# Patient Record
Sex: Female | Born: 1965 | Race: White | Hispanic: No | Marital: Married | Smoking: Never smoker
Health system: Southern US, Community
[De-identification: ages and names within clinical notes are randomized; demographics above are authoritative.]

## PROBLEM LIST (undated history)

## (undated) DIAGNOSIS — E785 Hyperlipidemia, unspecified: Secondary | ICD-10-CM

## (undated) DIAGNOSIS — F419 Anxiety disorder, unspecified: Secondary | ICD-10-CM

## (undated) DIAGNOSIS — Z8249 Family history of ischemic heart disease and other diseases of the circulatory system: Secondary | ICD-10-CM

## (undated) DIAGNOSIS — J45909 Unspecified asthma, uncomplicated: Secondary | ICD-10-CM

## (undated) DIAGNOSIS — O24419 Gestational diabetes mellitus in pregnancy, unspecified control: Secondary | ICD-10-CM

## (undated) HISTORY — DX: Unspecified asthma, uncomplicated: J45.909

## (undated) HISTORY — DX: Family history of ischemic heart disease and other diseases of the circulatory system: Z82.49

## (undated) HISTORY — DX: Gestational diabetes mellitus in pregnancy, unspecified control: O24.419

## (undated) HISTORY — DX: Hyperlipidemia, unspecified: E78.5

## (undated) HISTORY — DX: Anxiety disorder, unspecified: F41.9

---
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
## 2017-11-09 DIAGNOSIS — G43109 Migraine with aura, not intractable, without status migrainosus: Secondary | ICD-10-CM | POA: Insufficient documentation

## 2018-02-21 HISTORY — PX: COLONOSCOPY: SHX174

## 2018-02-21 LAB — HM COLONOSCOPY

## 2018-05-02 DIAGNOSIS — F320 Major depressive disorder, single episode, mild: Secondary | ICD-10-CM | POA: Insufficient documentation

## 2021-04-03 ENCOUNTER — Ambulatory Visit: Payer: 59 | Admitting: Family

## 2021-04-08 ENCOUNTER — Telehealth (INDEPENDENT_AMBULATORY_CARE_PROVIDER_SITE_OTHER): Payer: 59 | Admitting: Family

## 2021-04-08 ENCOUNTER — Other Ambulatory Visit: Payer: Self-pay

## 2021-04-08 ENCOUNTER — Encounter: Payer: Self-pay | Admitting: Family

## 2021-04-08 VITALS — BP 117/80 | Wt 150.0 lb

## 2021-04-08 DIAGNOSIS — F419 Anxiety disorder, unspecified: Secondary | ICD-10-CM | POA: Diagnosis not present

## 2021-04-08 DIAGNOSIS — Z780 Asymptomatic menopausal state: Secondary | ICD-10-CM

## 2021-04-08 DIAGNOSIS — Z8249 Family history of ischemic heart disease and other diseases of the circulatory system: Secondary | ICD-10-CM

## 2021-04-08 DIAGNOSIS — J301 Allergic rhinitis due to pollen: Secondary | ICD-10-CM

## 2021-04-08 DIAGNOSIS — Z124 Encounter for screening for malignant neoplasm of cervix: Secondary | ICD-10-CM

## 2021-04-08 DIAGNOSIS — F32A Depression, unspecified: Secondary | ICD-10-CM

## 2021-04-08 DIAGNOSIS — Z1231 Encounter for screening mammogram for malignant neoplasm of breast: Secondary | ICD-10-CM

## 2021-04-08 MED ORDER — AZELASTINE HCL 0.1 % NA SOLN: 2.0000 | Freq: Two times a day (BID) | NASAL | 1 refills | Status: DC

## 2021-04-08 MED ORDER — SERTRALINE HCL 50 MG PO TABS: 1.0000 | ORAL_TABLET | Freq: Every day | ORAL | 1 refills | Status: DC

## 2021-04-08 NOTE — Progress Notes (Signed)
Audrey Long is a 55 y.o. female with the following history as recorded in EpicCare:  Patient Active Problem List   Diagnosis Date Noted  . 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 albuterol (VENTOLIN HFA) 108 (90 Base) MCG/ACT inhaler Inhale into the lungs every 6 (six) hours as needed for wheezing or shortness of breath.    Marland Kitchen azelastine (ASTELIN) 0.1 % nasal spray Place 2 sprays into both nostrils 2 (two) times daily. Use in each nostril as directed 30 mL 1  . sertraline (ZOLOFT) 50 MG tablet Take 1 tablet (50 mg total) by mouth daily. 90 tablet 1   No current facility-administered medications for this visit.    Allergies: Patient has no allergy information on record.  No past medical history on file.    Family History  Problem Relation Age of Onset  . CAD Father     Social History   Tobacco Use  . Smoking status: Never Smoker  . Smokeless tobacco: Never Used  Substance Use Topics  . Alcohol use: Not Currently    Subjective:   I connected with Audrey Long on 04/08/21 at  1:00 PM EDT by a video enabled telemedicine application and verified that I am speaking with the correct person using two identifiers.   I discussed the limitations of evaluation and management by telemedicine and the availability of in person appointments. The patient expressed understanding and agreed to proceed. Provider in office/ patient is at home; provider and patient are only 2 people on video call.   Patient is a new patient; was originally scheduled for in person visit today but had to be converted to virtual visit as her husband has just tested positive for COVID; patient is requesting refill on her Zoloft until she can be re-scheduled for in person visit/ in person CPE;    Objective:  Vitals:   04/08/21 1258  BP: 117/80  Weight: 150 lb (68 kg)    General: Well developed, well nourished, in no acute distress  Skin : Warm  and dry.  Head: Normocephalic and atraumatic  Lungs: Respirations unlabored;  Neurologic: Alert and oriented; speech intact; face symmetrical;   Assessment:  1. Cervical cancer screening   2. Menopause   3. FH: CAD (coronary artery disease)   4. Screening mammogram, encounter for   5. Allergic rhinitis due to pollen, unspecified seasonality   6. Anxiety and depression     Plan:  1. Refer to GYN to establish care; 3. Refer to cardiology due to [place] of CAD; discussed use of to coronary calcium score and stress testing; cardiology will determine what testing is needed; 4. Order updated;  5. Refill on Astelin; 6. Stable; refill on Sertraline;  She will need to be seen in the office for fasting labs/ in person CPE and will schedule no sooner than 2 weeks due to husband's current COVID diagnosis;     No follow-ups on file.  Orders Placed This Encounter  Procedures  . MM Digital Screening    Standing Status:   Future    Standing Expiration Date:   04/08/2022    Order Specific Question:   Reason for Exam (SYMPTOM  OR DIAGNOSIS REQUIRED)    Answer:   screening mammogram    Order Specific Question:   Is the patient pregnant?    Answer:   No    Order Specific Question:   Preferred imaging location?    Answer:  [facility]  . Ambulatory referral to Gynecology    Referral Priority:   Routine    Referral Type:   Consultation    Referral Reason:   Specialty Services Required    Requested Specialty:   Gynecology    Number of Visits Requested:   1  . Ambulatory referral to Cardiology    Referral Priority:   Routine    Referral Type:   Consultation    Referral Reason:   Specialty Services Required    Requested Specialty:   Cardiology    Number of Visits Requested:   1    Requested Prescriptions   Signed Prescriptions Disp Refills  . azelastine (ASTELIN) 0.1 % nasal spray 30 mL 1    Sig: Place 2 sprays into both nostrils 2 (two) times daily. Use in each nostril as directed   . sertraline (ZOLOFT) 50 MG tablet 90 tablet 1    Sig: Take 1 tablet (50 mg total) by mouth daily.

## 2021-04-24 ENCOUNTER — Ambulatory Visit (HOSPITAL_BASED_OUTPATIENT_CLINIC_OR_DEPARTMENT_OTHER): Payer: 59 | Admitting: Radiology

## 2021-05-08 ENCOUNTER — Ambulatory Visit: Payer: 59 | Admitting: Cardiology

## 2021-05-08 ENCOUNTER — Encounter: Payer: Self-pay | Admitting: Cardiology

## 2021-05-08 ENCOUNTER — Other Ambulatory Visit: Payer: Self-pay

## 2021-05-08 VITALS — BP 108/70 | HR 81 | Ht 63.0 in | Wt 152.0 lb

## 2021-05-08 DIAGNOSIS — I517 Cardiomegaly: Secondary | ICD-10-CM | POA: Diagnosis not present

## 2021-05-08 DIAGNOSIS — E663 Overweight: Secondary | ICD-10-CM | POA: Diagnosis not present

## 2021-05-08 DIAGNOSIS — I2510 Atherosclerotic heart disease of native coronary artery without angina pectoris: Secondary | ICD-10-CM | POA: Diagnosis not present

## 2021-05-08 DIAGNOSIS — Z8249 Family history of ischemic heart disease and other diseases of the circulatory system: Secondary | ICD-10-CM | POA: Diagnosis not present

## 2021-05-08 NOTE — Patient Instructions (Signed)
Medication Instructions:  Your physician recommends that you continue on your current medications as directed. Please refer to the Current Medication list given to you today.  *If you need a refill on your cardiac medications before your next appointment, please call your pharmacy*   Lab Work: None If you have labs (blood work) drawn today and your tests are completely normal, you will receive your results only by: [place] (if you have MyChart) OR A paper copy in the mail If you have any lab test that is abnormal or we need to change your treatment, we will call you to review the results.   Testing/Procedures: Your physician has requested that you have an echocardiogram. Echocardiography is a painless test that uses sound waves to create images of your heart. It provides your doctor with information about the size and shape of your heart and how well your heart's chambers and valves are working. This procedure takes approximately one hour. There are no restrictions for this procedure.  We have put in the order for you to have a CT calcium score. They will call you to schedule this appointment.    Follow-Up: At [facility], you and your health needs are our priority.  As part of our continuing mission to provide you with exceptional heart care, we have created designated Provider Care Teams.  These Care Teams include your primary Cardiologist (physician) and Advanced Practice Providers (APPs -  Physician Assistants and Nurse Practitioners) who all work together to provide you with the care you need, when you need it.  We recommend signing up for the patient portal called "MyChart".  Sign up information is provided on this After Visit Summary.  MyChart is used to connect with patients for Virtual Visits (Telemedicine).  Patients are able to view lab/test results, encounter notes, upcoming appointments, etc.  Non-urgent messages can be sent to your provider as well.   To learn more  about what you can do with MyChart, go to [email or web address].    Your next appointment:   1 year(s)  The format for your next appointment:   In Person  Provider:   Berniece Salines, DO   Other Instructions

## 2021-05-08 NOTE — Progress Notes (Signed)
Cardiology Office Note:    Date:  05/08/2021   ID:  Audrey Long, DOB 10-29-1966, MRN [id number]  PCP:  Marrian Salvage, [place]  Cardiologist:  None  Electrophysiologist:  None   Referring MD: Marrian Salvage,*   No chief complaint on file.  " I want to get a baseline"  History of Present Illness:    Audrey Long is a 55 y.o. female with a hx of gestational hypertension, hyperlipidemia not on any statin lipid profile in February 2021 showed total cholesterol 286, HDL 77, LDL 185, premature history of coronary artery disease in her father is here today to establish cardiovascular baseline.  The patient denies chest pain, shortness of breath, palpitations, lightheadednes/dizziness, lower extremity edema, orthopnea or PND.  He tells me that her brother had a lot of testing done recently he had elevated calcium and she wanted to make sure that she was fine.  Past Medical History:  Diagnosis Date   Family history of premature CAD    Gestational diabetes    Hyperlipidemia     History reviewed. No pertinent surgical history.  Current Medications: Current Meds  Medication Sig   albuterol (VENTOLIN HFA) 108 (90 Base) MCG/ACT inhaler Inhale into the lungs every 6 (six) hours as needed for wheezing or shortness of breath.   azelastine (ASTELIN) 0.1 % nasal spray Place 2 sprays into both nostrils 2 (two) times daily. Use in each nostril as directed   sertraline (ZOLOFT) 50 MG tablet Take 1 tablet (50 mg total) by mouth daily.     Allergies:   Patient has no allergy information on record.   Social History   Socioeconomic History   Marital status: Married    Spouse name: Not on file   Number of children: Not on file   Years of education: Not on file   Highest education level: Not on file  Occupational History   Not on file  Tobacco Use   Smoking status: Never   Smokeless tobacco: Never  Substance and Sexual Activity   Alcohol use: Not Currently   Drug use: Never    Sexual activity: Not on file  Other Topics Concern   Not on file  Social History Narrative   Not on file   Social Determinants of Health   Financial Resource Strain: Not on file  Food Insecurity: Not on file  Transportation Needs: Not on file  Physical Activity: Not on file  Stress: Not on file  Social Connections: Not on file     Family History: The patient's family history includes CAD in her father.  ROS:   Review of Systems  Constitution: Negative for decreased appetite, fever and weight gain.  HENT: Negative for congestion, ear discharge, hoarse voice and sore throat.   Eyes: Negative for discharge, redness, vision loss in right eye and visual halos.  Cardiovascular: Negative for chest pain, dyspnea on exertion, leg swelling, orthopnea and palpitations.  Respiratory: Negative for cough, hemoptysis, shortness of breath and snoring.   Endocrine: Negative for heat intolerance and polyphagia.  Hematologic/Lymphatic: Negative for bleeding problem. Does not bruise/bleed easily.  Skin: Negative for flushing, nail changes, rash and suspicious lesions.  Musculoskeletal: Negative for arthritis, joint pain, muscle cramps, myalgias, neck pain and stiffness.  Gastrointestinal: Negative for abdominal pain, bowel incontinence, diarrhea and excessive appetite.  Genitourinary: Negative for decreased libido, genital sores and incomplete emptying.  Neurological: Negative for brief paralysis, focal weakness, headaches and loss of balance.  Psychiatric/Behavioral: Negative for altered mental status, depression  and suicidal ideas.  Allergic/Immunologic: Negative for HIV exposure and persistent infections.    EKGs/Labs/Other Studies Reviewed:    The following studies were reviewed today:   EKG:  The ekg ordered today demonstrates sinus rhythm, heart rate 81 bpm with P wave morphology suggesting left atrial lodgment.  Recent Labs: No results found for requested labs within last 8760  hours.  Recent Lipid Panel No results found for: CHOL, TRIG, HDL, CHOLHDL, VLDL, LDLCALC, LDLDIRECT  Physical Exam:    VS:  BP 108/70 (BP Location: Right Arm)   Pulse 81   Ht 5\' 3"  (1.6 m)   Wt 152 lb (68.9 kg)   SpO2 98%   BMI 26.93 kg/m     Wt Readings from Last 3 Encounters:  05/08/21 152 lb (68.9 kg)  04/08/21 150 lb (68 kg)     GEN: Well nourished, well developed in no acute distress HEENT: Normal NECK: No JVD; No carotid bruits LYMPHATICS: No lymphadenopathy CARDIAC: S1S2 noted,RRR, no murmurs, rubs, gallops RESPIRATORY:  Clear to auscultation without rales, wheezing or rhonchi  ABDOMEN: Soft, non-tender, non-distended, +bowel sounds, no guarding. EXTREMITIES: No edema, No cyanosis, no clubbing MUSCULOSKELETAL:  No deformity  SKIN: Warm and dry NEUROLOGIC:  Alert and oriented x 3, non-focal PSYCHIATRIC:  Normal affect, good insight  ASSESSMENT:    1. Left atrial enlargement   2. ASCVD (arteriosclerotic cardiovascular disease)   3. Overweight (BMI 25.0-29.9)   4. Family history of premature CAD    PLAN:     1.  Given her family history and hyperlipidemia we calculated her ASCVD 10-year risk score 1.5%.  The patient is interested in getting coronary calcium scoring.  We will proceed with ordering this testing for the patient.  2.  EKG showed evidence of left atrial enlargement and echocardiogram will be appropriate to make sure that there is no structural abnormalities in this patient.  3.  She is continuing with her diet modification for hyperlipidemia for now.  4.  The patient understands the need to lose weight with diet and exercise. We have discussed specific strategies for this.  The patient is in agreement with the above plan. The patient left the office in stable condition.  The patient will follow up in 1 year or sooner if needed.   Medication Adjustments/Labs and Tests Ordered: Current medicines are reviewed at length with the patient today.   Concerns regarding medicines are outlined above.  Orders Placed This Encounter  Procedures   CT CARDIAC SCORING (SELF PAY ONLY)   EKG 12-Lead   ECHOCARDIOGRAM COMPLETE   No orders of the defined types were placed in this encounter.   Patient Instructions  Medication Instructions:  Your physician recommends that you continue on your current medications as directed. Please refer to the Current Medication list given to you today.  *If you need a refill on your cardiac medications before your next appointment, please call your pharmacy*   Lab Work: None If you have labs (blood work) drawn today and your tests are completely normal, you will receive your results only by: [place] (if you have MyChart) OR A paper copy in the mail If you have any lab test that is abnormal or we need to change your treatment, we will call you to review the results.   Testing/Procedures: Your physician has requested that you have an echocardiogram. Echocardiography is a painless test that uses sound waves to create images of your heart. It provides your doctor with information about the size  and shape of your heart and how well your heart's chambers and valves are working. This procedure takes approximately one hour. There are no restrictions for this procedure.  We have put in the order for you to have a CT calcium score. They will call you to schedule this appointment.    Follow-Up: At [facility], you and your health needs are our priority.  As part of our continuing mission to provide you with exceptional heart care, we have created designated Provider Care Teams.  These Care Teams include your primary Cardiologist (physician) and Advanced Practice Providers (APPs -  Physician Assistants and Nurse Practitioners) who all work together to provide you with the care you need, when you need it.  We recommend signing up for the patient portal called "MyChart".  Sign up information is provided on this  After Visit Summary.  MyChart is used to connect with patients for Virtual Visits (Telemedicine).  Patients are able to view lab/test results, encounter notes, upcoming appointments, etc.  Non-urgent messages can be sent to your provider as well.   To learn more about what you can do with MyChart, go to [email or web address].    Your next appointment:   1 year(s)  The format for your next appointment:   In Person  Provider:   Berniece Salines, DO   Other Instructions    Adopting a Healthy Lifestyle.  Know what a healthy weight is for you (roughly BMI <25) and aim to maintain this   Aim for 7+ servings of fruits and vegetables daily   65-80+ fluid ounces of water or unsweet tea for healthy kidneys   Limit to max 1 drink of alcohol per day; avoid smoking/tobacco   Limit animal fats in diet for cholesterol and heart health - choose grass fed whenever available   Avoid highly processed foods, and foods high in saturated/trans fats   Aim for low stress - take time to unwind and care for your mental health   Aim for 150 min of moderate intensity exercise weekly for heart health, and weights twice weekly for bone health   Aim for 7-9 hours of sleep daily   When it comes to diets, agreement about the perfect plan isnt easy to find, even among the experts. Experts at the [employer] developed an idea known as the Healthy Eating Plate. Just imagine a plate divided into logical, healthy portions.   The emphasis is on diet quality:   Load up on vegetables and fruits - one-half of your plate: Aim for color and variety, and remember that potatoes dont count.   Go for whole grains - one-quarter of your plate: Whole wheat, barley, wheat berries, quinoa, oats, brown rice, and foods made with them. If you want pasta, go with whole wheat pasta.   Protein power - one-quarter of your plate: Fish, chicken, beans, and nuts are all healthy, versatile protein sources. Limit red  meat.   The diet, however, does go beyond the plate, offering a few other suggestions.   Use healthy plant oils, such as olive, canola, soy, corn, sunflower and peanut. Check the labels, and avoid partially hydrogenated oil, which have unhealthy trans fats.   If youre thirsty, drink water. Coffee and tea are good in moderation, but skip sugary drinks and limit milk and dairy products to one or two daily servings.   The type of carbohydrate in the diet is more important than the amount. Some sources of carbohydrates, such as vegetables, fruits,  whole grains, and beans-are healthier than others.   Finally, stay active  Signed, Berniece Salines, DO  05/08/2021 10:48 AM    [place]

## 2021-05-20 LAB — HM MAMMOGRAPHY

## 2021-05-20 LAB — HM PAP SMEAR: HM Pap smear: NEGATIVE

## 2021-05-22 ENCOUNTER — Ambulatory Visit (HOSPITAL_BASED_OUTPATIENT_CLINIC_OR_DEPARTMENT_OTHER): Payer: 59 | Admitting: Radiology

## 2021-06-02 ENCOUNTER — Ambulatory Visit (HOSPITAL_COMMUNITY): Payer: 59 | Attending: Cardiology

## 2021-06-02 ENCOUNTER — Other Ambulatory Visit: Payer: Self-pay

## 2021-06-02 ENCOUNTER — Ambulatory Visit (INDEPENDENT_AMBULATORY_CARE_PROVIDER_SITE_OTHER)
Admission: RE | Admit: 2021-06-02 | Discharge: 2021-06-02 | Disposition: A | Discharge status: Home / Self Care | Payer: Self-pay | Source: Ambulatory Visit | Attending: Cardiology | Admitting: Cardiology

## 2021-06-02 DIAGNOSIS — I517 Cardiomegaly: Secondary | ICD-10-CM | POA: Diagnosis not present

## 2021-06-02 LAB — ECHOCARDIOGRAM COMPLETE
Area-P 1/2: 2.96 cm2
P 1/2 time: 589 msec
S' Lateral: 2.3 cm

## 2021-06-03 ENCOUNTER — Other Ambulatory Visit: Payer: Self-pay

## 2021-06-03 ENCOUNTER — Telehealth: Payer: Self-pay | Admitting: Cardiology

## 2021-06-03 DIAGNOSIS — K769 Liver disease, unspecified: Secondary | ICD-10-CM

## 2021-06-03 NOTE — Telephone Encounter (Signed)
Audrey Long with [facility] called to report abnormal CT results. Unable to contact nurse for transfer.

## 2021-06-03 NOTE — Telephone Encounter (Signed)
Spoke with staff at [facility]. She states there is Suspicion of a hepatic dome hypoattenuating lesion. Suggesting MRI. Will relay message to Dr. Harriet Masson.

## 2021-06-03 NOTE — Telephone Encounter (Signed)
Spoke with about her results and additional testing needed.

## 2021-06-03 NOTE — Telephone Encounter (Signed)
Please schedule patient for an abdominal MRI.  And also further information to her PCP as well.

## 2021-06-04 NOTE — Telephone Encounter (Signed)
Patient was returning call to have mri schedule. Please advise

## 2021-06-04 NOTE — Telephone Encounter (Signed)
Called patient to let her know the hospital will reach out to her to set up the MRI. She verbalized understanding. She asked if her son could hold off on getting the CT scan until Dr. Harriet Masson looks over his monitor that he mailed in today due to cost. Will relay information to Dr. Harriet Masson and get back to patient. Arlee Bossard 09-20-2002)

## 2021-06-14 ENCOUNTER — Ambulatory Visit (HOSPITAL_BASED_OUTPATIENT_CLINIC_OR_DEPARTMENT_OTHER): Payer: 59

## 2021-06-21 ENCOUNTER — Ambulatory Visit (HOSPITAL_BASED_OUTPATIENT_CLINIC_OR_DEPARTMENT_OTHER)
Admission: RE | Admit: 2021-06-21 | Discharge: 2021-06-21 | Disposition: A | Discharge status: Home / Self Care | Payer: 59 | Source: Ambulatory Visit | Attending: Cardiology | Admitting: Cardiology

## 2021-06-21 ENCOUNTER — Other Ambulatory Visit: Payer: Self-pay

## 2021-06-21 DIAGNOSIS — K769 Liver disease, unspecified: Secondary | ICD-10-CM | POA: Diagnosis present

## 2021-06-21 MED ORDER — GADOBUTROL 1 MMOL/ML IV SOLN
6.0000 mL | Freq: Once | INTRAVENOUS | Status: AC | PRN
  Administered 2021-06-21: 6 mL via INTRAVENOUS

## 2021-06-23 ENCOUNTER — Telehealth: Payer: Self-pay

## 2021-06-23 ENCOUNTER — Telehealth: Payer: Self-pay | Admitting: Cardiology

## 2021-06-23 DIAGNOSIS — R932 Abnormal findings on diagnostic imaging of liver and biliary tract: Secondary | ICD-10-CM

## 2021-06-23 NOTE — Telephone Encounter (Signed)
Follow Up:     Patient said she would like for Dr Harriet Masson to please give her a call. She wants to know why she is referring her to a GI doctor?e

## 2021-06-23 NOTE — Telephone Encounter (Signed)
Patient aware of recommendations. I sent referral  GI in [facility]

## 2021-06-23 NOTE — Telephone Encounter (Signed)
-----   Message from Berniece Salines, DO sent at 06/23/2021  1:30 PM EDT ----- Please refer the patient to GI due to the findings found on the MRI of the liver

## 2021-07-14 ENCOUNTER — Telehealth: Payer: Self-pay

## 2021-07-14 NOTE — Telephone Encounter (Signed)
After hours call PatientName:Audrey Long Gender: Unknown DOB: 05-28-1966 Age: 55 Y 8 M 22 D ReturnphoneNumber: [id number] Physician Jodi Mourning- NP  ---Caller states she needs a refill of Sertraline. States she is completely out. Normally takes '50mg'$  daily. Use Krist[facility] Oppenheim on [employer] [phone]. Attempted  Last seen May 2022 on a virtual visit.

## 2021-07-15 ENCOUNTER — Telehealth: Payer: Self-pay | Admitting: Family

## 2021-07-15 MED ORDER — SERTRALINE HCL 50 MG PO TABS: 50.0000 mg | ORAL_TABLET | Freq: Every day | ORAL | 0 refills | Status: DC

## 2021-07-15 NOTE — Telephone Encounter (Signed)
She needs in office visit- I only "met" her on a virtual visit for new patient and we discussed need for in person visit.  Will send in one more refill on Zoloft for 90 days but needs OV before further refills can be given.

## 2021-07-15 NOTE — Telephone Encounter (Signed)
I have called pt and she now has an  appointment for f/u on meds for Friday '@1'$ :20p

## 2021-07-18 ENCOUNTER — Ambulatory Visit: Payer: 59 | Admitting: Family

## 2021-07-18 ENCOUNTER — Encounter: Payer: Self-pay | Admitting: Family

## 2021-07-18 ENCOUNTER — Other Ambulatory Visit: Payer: Self-pay

## 2021-07-18 ENCOUNTER — Ambulatory Visit (INDEPENDENT_AMBULATORY_CARE_PROVIDER_SITE_OTHER): Payer: 59 | Admitting: Gastroenterology

## 2021-07-18 ENCOUNTER — Encounter: Payer: Self-pay | Admitting: Gastroenterology

## 2021-07-18 VITALS — BP 106/70 | HR 84 | Temp 98.2°F | Ht 63.0 in | Wt 153.6 lb

## 2021-07-18 VITALS — BP 108/78 | HR 80 | Ht 63.0 in | Wt 153.0 lb

## 2021-07-18 DIAGNOSIS — L989 Disorder of the skin and subcutaneous tissue, unspecified: Secondary | ICD-10-CM | POA: Diagnosis not present

## 2021-07-18 DIAGNOSIS — D1803 Hemangioma of intra-abdominal structures: Secondary | ICD-10-CM

## 2021-07-18 DIAGNOSIS — F320 Major depressive disorder, single episode, mild: Secondary | ICD-10-CM

## 2021-07-18 DIAGNOSIS — K760 Fatty (change of) liver, not elsewhere classified: Secondary | ICD-10-CM

## 2021-07-18 DIAGNOSIS — J3089 Other allergic rhinitis: Secondary | ICD-10-CM

## 2021-07-18 DIAGNOSIS — E7800 Pure hypercholesterolemia, unspecified: Secondary | ICD-10-CM | POA: Diagnosis not present

## 2021-07-18 DIAGNOSIS — R7303 Prediabetes: Secondary | ICD-10-CM | POA: Diagnosis not present

## 2021-07-18 NOTE — Progress Notes (Signed)
PaP and Mammo Abstracted.

## 2021-07-18 NOTE — Progress Notes (Addendum)
Chief Complaint: Hepatic lesion on cardiac CT   Referring Provider:     Berniece Salines, DO   HPI:     Audrey Long is a 55 y.o. female with a history of gestational HTN, HLD, referred to the Gastroenterology Clinic for evaluation of hepatic lesion noted on Cardiac CT.  She otherwise has no GI complaints today.  No known family or personal history of liver disease.  No personal history of ascites, encephalopathy, jaundice, icteric sclera, GI bleed, etc.  - 01/11/2020: Normal CMP - 06/03/2021: Cardiac CT: Coronary calcium score of 0.  Suspect a high right hepatic lobe hypoattenuating 2.6 cm lesion, suboptimally evaluated on this study.  Recommend MRI. - 06/23/2021: MRI abdomen: Mild diffuse hepatic steatosis.  Lobulated 4.0 x 2.0 x 2.2 cm hyperintense lesion in the hepatic dome c/w hepatic hemangioma.  Likely tiny 5 mm adjacent hemangioma.  Subcentimeter cyst in right lobe of liver.  Normal pancreas, no duct dilation.  Consider repeat MRI liver protocol in 6-12 months to ensure stability  She reports having labs done by her GYN in the last 2 months. Will try to obtain those and if LAEs done and again normal, no further w/u needed.   - Colonoscopy (02/21/2018): Normal per patient with 10 year repeat   No known family history of CRC, GI malignancy, liver disease, pancreatic disease, or IBD.    Past Medical History:  Diagnosis Date   Family history of premature CAD    Gestational diabetes    Hyperlipidemia      Past Surgical History:  Procedure Laterality Date   APPENDECTOMY     HERNIA REPAIR     Family History  Problem Relation Age of Onset   CAD Father    Colon cancer Neg Hx    Esophageal cancer Neg Hx    Pancreatic cancer Neg Hx    Liver disease Neg Hx    Stomach cancer Neg Hx    Social History   Tobacco Use   Smoking status: Never   Smokeless tobacco: Never  Vaping Use   Vaping Use: Never used  Substance Use Topics   Alcohol use: Not Currently   Drug use:  Never   Current Outpatient Medications  Medication Sig Dispense Refill   albuterol (VENTOLIN HFA) 108 (90 Base) MCG/ACT inhaler Inhale into the lungs every 6 (six) hours as needed for wheezing or shortness of breath.     azelastine (ASTELIN) 0.1 % nasal spray Place 2 sprays into both nostrils 2 (two) times daily. Use in each nostril as directed 30 mL 1   sertraline (ZOLOFT) 50 MG tablet Take 1 tablet (50 mg total) by mouth daily. 90 tablet 0   No current facility-administered medications for this visit.   Not on File   Review of Systems: All systems reviewed and negative except where noted in HPI.     Physical Exam:    Wt Readings from Last 3 Encounters:  07/18/21 153 lb (69.4 kg)  05/08/21 152 lb (68.9 kg)  04/08/21 150 lb (68 kg)    BP 108/78   Pulse 80   Ht '5\' 3"'$  (1.6 m)   Wt 153 lb (69.4 kg)   SpO2 98%   BMI 27.10 kg/m  Constitutional:  Pleasant, in no acute distress. Psychiatric: Normal mood and affect. Behavior is normal. Neurological: Alert and oriented to person place and time. Skin: Skin is warm and dry. No rashes noted.  ASSESSMENT AND PLAN;   1) Hepatic hemangioma Discussed MRI finding consistent with benign hepatic and angioma.  Discussed pathophysiology and natural course of hemangiomata with plan as follows: -Repeat MRI Liver protocol in 6-12 months to demonstrate size/appearance stability.  If no significant change, no further imaging needs to be done  2) Hepatic steatosis MRI also with incidental mild hepatic steatosis.  No known history of liver disease.  Liver enzymes from 2021 were normal.  She reports recently having repeat labs done. - Will obtain copy of her recent labs. If normal LAEs, no further serologies needed at this time - Recommended diet and regular aerobic exercise   Lavena Bullion, DO, FACG  07/18/2021, 11:25 AM   Marrian Salvage,*  Addendum: Received labs after clinic appointment and no closure.  Labs collected on  05/12/2021 notable following: - WBC 7.5, H/H 13.7/41.8, PLT 289, MCV/RDW 92.5/14.1 - TSH normal at 1.27 - ALP 154, otherwise normal CMP with AST/ALT 24/25, albumin 4.7, T bili 0.2 - Hemoglobin A1c 6.2%

## 2021-07-18 NOTE — Progress Notes (Signed)
Patient made aware to go to [address] for lab work next week.

## 2021-07-18 NOTE — Patient Instructions (Addendum)
If you are age 56 or older, your body mass index should be between 23-30. Your Body mass index is 27.1 kg/m. If this is out of the aforementioned range listed, please consider follow up with your Primary Care Provider.  If you are age 72 or younger, your body mass index should be between 19-25. Your Body mass index is 27.1 kg/m. If this is out of the aformentioned range listed, please consider follow up with your Primary Care Provider.   __________________________________________________________  The  GI providers would like to encourage you to use [facility] to communicate with providers for non-urgent requests or questions.  Due to long hold times on the telephone, sending your provider a message by [facility] may be a faster and more efficient way to get a response.  Please allow 48 business hours for a response.  Please remember that this is for non-urgent requests.   Fax number is [phone] to fax records to [place].  We will plan for a MRI liver in 6-12 months. Please call in February 2023 for this at [phone] to schedule this. Order has been placed for [employer].  You have been scheduled for an MRI at        on     . Your appointment time is    . Please arrive to admitting (at main entrance of the hospital) 15 minutes prior to your appointment time for registration purposes. Please make certain not to have anything to eat or drink 6 hours prior to your test. In addition, if you have any metal in your body, have a pacemaker or defibrillator, please be sure to let your ordering physician know. This test typically takes 45 minutes to 1 hour to complete. Should you need to reschedule, please call [phone] to do so.   Please call with any questions or concerns.  It was a pleasure to see you today!  Vito Cirigliano, D.O.

## 2021-07-18 NOTE — Progress Notes (Signed)
Audrey Long is a 55 y.o. 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     azelastine (ASTELIN) 0.1 % nasal spray Place 2 sprays into both nostrils 2 (two) times daily. Use in each nostril as directed 30 mL 1   sertraline (ZOLOFT) 50 MG tablet Take 1 tablet (50 mg total) by mouth daily. 90 tablet 0   No current facility-administered medications for this visit.    Allergies: Patient has no known allergies.  Past Medical History:  Diagnosis Date   Anxiety    Asthma    seasonal   Family history of premature CAD    Gestational diabetes    Hyperlipidemia     Past Surgical History:  Procedure Laterality Date   APPENDECTOMY  01/1983   COLONOSCOPY  02/21/2018   HERNIA REPAIR  06/1966    Family History  Problem Relation Age of Onset   CAD Father    Colon cancer Neg Hx    Esophageal cancer Neg Hx    Pancreatic cancer Neg Hx    Liver disease Neg Hx    Stomach cancer Neg Hx     Social History   Tobacco Use   Smoking status: Never   Smokeless tobacco: Never  Substance Use Topics   Alcohol use: Not Currently    Subjective:  Presents today for follow up chronic care needs;  History of hyperlipidemia- has seen cardiology; ASCVD risk factor at 1.5%;  Under care of GI for hepatic hemangioma;  Labs done at GYN do show pre-diabetes;   Colonoscopy 02/2018- repeat due in 10 years;      Objective:  Vitals:   07/18/21 1338  BP: 106/70  Pulse: 84  Temp: 98.2 F (36.8 C)  TempSrc: Oral  SpO2: 99%  Weight: 153 lb 9.6 oz (69.7 kg)  Height: '5\' 3"'$  (1.6 m)    General: Well developed, well nourished, in no acute distress  Skin : Warm  and dry.  Head: Normocephalic and atraumatic  Eyes: Sclera and conjunctiva clear; pupils round and reactive to light; extraocular movements intact  Ears: External normal; canals clear; tympanic membranes normal  Oropharynx: Pink, supple. No suspicious lesions  Neck: Supple without thyromegaly, adenopathy  Lungs: Respirations unlabored; clear to auscultation bilaterally without wheeze, rales, rhonchi  CVS exam: normal rate and regular rhythm.  Neurologic: Alert and oriented; speech intact; face symmetrical; moves all extremities well; CNII-XII intact without focal deficit   Assessment:  1. Skin lesion   2. Pre-diabetes   3. Mild major depression (HCC)   4. Elevated LDL cholesterol level   5. Allergic rhinitis due to other allergic trigger, unspecified seasonality     Plan:  Refer to dermatology; Reviewed recent set of labs from GYN- encouraged to work on sugar reduction/ increased exercise; plan to re-check labs in 6 months; Stable; refill on Zoloft was updated earlier this week;  Reviewed recent labs; her ASCVD score is 1.5%- agree does not need to start statin; Trial of Allegra 180 mg;   This visit occurred during the SARS-CoV-2 public health emergency.  Safety protocols were in place, including screening questions prior to the visit, additional usage of staff  PPE, and extensive cleaning of exam room while observing appropriate contact time as indicated for disinfecting solutions.    Return in about 6 months (around 01/18/2022) for lab visit/ Hgba1c.  Orders Placed This Encounter  Procedures   Ambulatory referral to Dermatology    Referral Priority:   Routine    Referral Type:   Consultation    Referral Reason:   Specialty Services Required    Requested Specialty:   Dermatology    Number of Visits Requested:   1   [facility] COLONOSCOPY    This external order was created through the Results Console.    Requested Prescriptions    No prescriptions requested or ordered in this encounter

## 2021-09-16 ENCOUNTER — Other Ambulatory Visit: Payer: Self-pay | Admitting: Family

## 2021-09-16 NOTE — Telephone Encounter (Signed)
Pt stated she needed her prescription for albuterol (VENTOLIN HFA) 108 (90 Base) MCG/ACT inhaler   sent to     [facility] [address], [place], [place] [address]  [phone]

## 2021-09-18 MED ORDER — ALBUTEROL SULFATE HFA 108 (90 BASE) MCG/ACT IN AERS: 1.0000 | INHALATION_SPRAY | Freq: Four times a day (QID) | RESPIRATORY_TRACT | 1 refills | Status: DC | PRN

## 2021-09-18 NOTE — Telephone Encounter (Signed)
Pt was last seen on 07/18/21. She doesn't have a follow up scheduled yet and rx is on her med list but we have not given it to her. Rx has been pended if it is okay to send please sign.

## 2021-10-03 ENCOUNTER — Other Ambulatory Visit: Payer: Self-pay | Admitting: Family

## 2021-11-02 ENCOUNTER — Other Ambulatory Visit: Payer: Self-pay | Admitting: Family

## 2022-01-19 ENCOUNTER — Other Ambulatory Visit: Payer: 59

## 2022-01-26 ENCOUNTER — Telehealth: Payer: Self-pay | Admitting: Gastroenterology

## 2022-01-26 ENCOUNTER — Other Ambulatory Visit (INDEPENDENT_AMBULATORY_CARE_PROVIDER_SITE_OTHER): Payer: PRIVATE HEALTH INSURANCE

## 2022-01-26 ENCOUNTER — Telehealth: Payer: Self-pay | Admitting: Family

## 2022-01-26 DIAGNOSIS — K760 Fatty (change of) liver, not elsewhere classified: Secondary | ICD-10-CM | POA: Diagnosis not present

## 2022-01-26 DIAGNOSIS — D1803 Hemangioma of intra-abdominal structures: Secondary | ICD-10-CM | POA: Diagnosis not present

## 2022-01-26 LAB — GAMMA GT: GGT: 14 U/L (ref 7–51)

## 2022-01-26 MED ORDER — SERTRALINE HCL 50 MG PO TABS: 50.0000 mg | ORAL_TABLET | Freq: Every day | ORAL | 0 refills | Status: DC

## 2022-01-26 NOTE — Telephone Encounter (Signed)
I have called pt and scheduled a follow up appt with Audrey Long in the next couple of months and refilled her rx until then.  ? ?For the MRI she will have to contact the ordering provider to reorder the imaging and she stated understanding about that as well.  ?

## 2022-01-26 NOTE — Telephone Encounter (Signed)
Pt came in office stating that she had an order for MRI to get done at [facility] but the order expired, pt is requesting a new order for MRI. Please advise.  ?

## 2022-01-26 NOTE — Telephone Encounter (Signed)
Message sent to [place] who stated that MRI was approved. Gave pt number to radiology scheduling so she can schedule MRI.  ?

## 2022-01-26 NOTE — Telephone Encounter (Signed)
Patient called requesting a new MRI order to be placed in Epic. Call her once done so she knows. ?

## 2022-01-30 ENCOUNTER — Encounter: Payer: Self-pay | Admitting: Gastroenterology

## 2022-02-02 ENCOUNTER — Other Ambulatory Visit: Payer: Self-pay

## 2022-02-02 ENCOUNTER — Ambulatory Visit (HOSPITAL_COMMUNITY): Payer: PRIVATE HEALTH INSURANCE

## 2022-02-02 DIAGNOSIS — K760 Fatty (change of) liver, not elsewhere classified: Secondary | ICD-10-CM

## 2022-02-02 DIAGNOSIS — D1803 Hemangioma of intra-abdominal structures: Secondary | ICD-10-CM

## 2022-02-02 NOTE — Telephone Encounter (Signed)
Please send to lab for hepatic panel. Thanks.  ?

## 2022-02-02 NOTE — Telephone Encounter (Signed)
Placed lab for hepatic function panel. Called pt to let her know that the lab was placed. Pt verbalized understanding and had no other concerns at end of call.  ?

## 2022-02-10 ENCOUNTER — Ambulatory Visit (HOSPITAL_COMMUNITY): Payer: PRIVATE HEALTH INSURANCE

## 2022-02-23 NOTE — Telephone Encounter (Signed)
Called pt to follow up on lab order. Pt states she will be going today.  ?

## 2022-03-13 ENCOUNTER — Ambulatory Visit: Payer: PRIVATE HEALTH INSURANCE | Admitting: Medical

## 2022-04-09 ENCOUNTER — Other Ambulatory Visit (INDEPENDENT_AMBULATORY_CARE_PROVIDER_SITE_OTHER): Payer: PRIVATE HEALTH INSURANCE

## 2022-04-09 DIAGNOSIS — K760 Fatty (change of) liver, not elsewhere classified: Secondary | ICD-10-CM | POA: Diagnosis not present

## 2022-04-09 DIAGNOSIS — D1803 Hemangioma of intra-abdominal structures: Secondary | ICD-10-CM

## 2022-04-09 LAB — HEPATIC FUNCTION PANEL
ALT: 11 U/L (ref 0–35)
AST: 17 U/L (ref 0–37)
Albumin: 4.7 g/dL (ref 3.5–5.2)
Alkaline Phosphatase: 124 U/L — ABNORMAL HIGH (ref 39–117)
Bilirubin, Direct: 0.1 mg/dL (ref 0.0–0.3)
Total Bilirubin: 0.4 mg/dL (ref 0.2–1.2)
Total Protein: 7.5 g/dL (ref 6.0–8.3)

## 2022-04-14 ENCOUNTER — Telehealth (INDEPENDENT_AMBULATORY_CARE_PROVIDER_SITE_OTHER): Payer: PRIVATE HEALTH INSURANCE | Admitting: Family

## 2022-04-14 VITALS — BP 114/78 | HR 107 | Temp 97.7°F | Resp 18

## 2022-04-14 DIAGNOSIS — R7303 Prediabetes: Secondary | ICD-10-CM | POA: Diagnosis not present

## 2022-04-14 DIAGNOSIS — F32A Depression, unspecified: Secondary | ICD-10-CM

## 2022-04-14 DIAGNOSIS — U071 COVID-19: Secondary | ICD-10-CM

## 2022-04-14 DIAGNOSIS — F419 Anxiety disorder, unspecified: Secondary | ICD-10-CM

## 2022-04-14 DIAGNOSIS — E7800 Pure hypercholesterolemia, unspecified: Secondary | ICD-10-CM | POA: Diagnosis not present

## 2022-04-14 MED ORDER — SERTRALINE HCL 50 MG PO TABS: 50.0000 mg | ORAL_TABLET | Freq: Every day | ORAL | 3 refills | Status: DC

## 2022-04-14 NOTE — Progress Notes (Signed)
Audrey Long is a 56 y.o. 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 18 g 1   azelastine (ASTELIN) 0.1 % nasal spray Place 2 sprays into both nostrils 2 (two) times daily. Use in each nostril as directed 30 mL 1   sertraline (ZOLOFT) 50 MG tablet Take 1 tablet (50 mg total) by mouth daily. 90 tablet 3   No current facility-administered medications for this visit.    Allergies: Patient has no known allergies.  Past Medical History:  Diagnosis Date   Anxiety    Asthma    seasonal   Family history of premature CAD    Gestational diabetes    Hyperlipidemia     Past Surgical History:  Procedure Laterality Date   APPENDECTOMY  01/1983   COLONOSCOPY  02/21/2018   HERNIA REPAIR  06/1966    Family History  Problem Relation Age of Onset   CAD Father    Colon cancer Neg Hx    Esophageal cancer Neg Hx    Pancreatic cancer Neg Hx    Liver disease Neg Hx    Stomach cancer Neg Hx     Social History   Tobacco Use   Smoking status: Never   Smokeless tobacco: Never  Substance Use Topics   Alcohol use: Not Currently    Subjective:   I connected with Audrey Long on 04/14/22 at  9:40 AM EDT by a video enabled telemedicine application and verified that I am speaking with the correct person using two identifiers.   I discussed the limitations of evaluation and management by telemedicine and the availability of in person appointments. The patient expressed understanding and agreed to proceed. Provider in office/ patient is at home; provider and patient are only 2 people on video call.   + COVID; symptoms started on Sunday; +  congestion/ body aches; low grade fever; no shortness of breath;   OV was originally scheduled for follow up on Zoloft- does feel that is doing well on this medicaton/ requesting refill;  Due for repeat labs on Hgba1c; will be scheduling to see her GYN by early July [facility]);     Objective:  Vitals:   04/14/22 0939  BP: 114/78  Pulse: (!) 107  Resp: 18  Temp: 97.7 F (36.5 C)  TempSrc: Oral    General: Well developed, well nourished, in no acute distress  Skin : Warm and dry.  Head: Normocephalic and atraumatic  Lungs: Respirations unlabored;  Neurologic: Alert and oriented; speech intact; face symmetrical; moves all extremities well; CNII-XII intact without focal deficit   Assessment:  1. COVID-19   2. Anxiety and depression   3. Pre-diabetes   4. Elevated LDL cholesterol level     Plan:  Symptomatic treatment discussed; will hold anti-viral at this time; she will call back if she decides she wants the medication; Stable- refill updated; Patient will return for fasting labs at later date; she will call and make her own appointment. Due for repeat labs- follow up to be determined;   No follow-ups on file.  No orders of the defined types were placed in this encounter.  Requested Prescriptions   Signed Prescriptions Disp Refills   sertraline (ZOLOFT) 50 MG tablet 90 tablet 3    Sig: Take 1 tablet (50 mg total) by mouth daily.

## 2022-04-17 ENCOUNTER — Telehealth: Payer: Self-pay | Admitting: Family

## 2022-04-17 ENCOUNTER — Other Ambulatory Visit: Payer: Self-pay | Admitting: Family

## 2022-04-17 MED ORDER — AMOXICILLIN-POT CLAVULANATE 875-125 MG PO TABS: 1.0000 | ORAL_TABLET | Freq: Two times a day (BID) | ORAL | 0 refills | Status: AC

## 2022-04-17 NOTE — Telephone Encounter (Signed)
Patient states she spoke to Mickel Baas about her covid diagnoses and did not want the antiviral but now she has a sinus infection and wants to know if there's an antibiotic she can take for it. Please advice.

## 2022-05-13 ENCOUNTER — Telehealth: Payer: Self-pay | Admitting: Family

## 2022-05-13 ENCOUNTER — Other Ambulatory Visit: Payer: Self-pay | Admitting: Family

## 2022-05-13 ENCOUNTER — Other Ambulatory Visit (INDEPENDENT_AMBULATORY_CARE_PROVIDER_SITE_OTHER): Payer: PRIVATE HEALTH INSURANCE

## 2022-05-13 DIAGNOSIS — R7303 Prediabetes: Secondary | ICD-10-CM

## 2022-05-13 DIAGNOSIS — E7800 Pure hypercholesterolemia, unspecified: Secondary | ICD-10-CM | POA: Diagnosis not present

## 2022-05-13 LAB — CBC WITH DIFFERENTIAL/PLATELET
Basophils Absolute: 0 10*3/uL (ref 0.0–0.1)
Basophils Relative: 0.5 % (ref 0.0–3.0)
Eosinophils Absolute: 0.2 10*3/uL (ref 0.0–0.7)
Eosinophils Relative: 2.9 % (ref 0.0–5.0)
HCT: 40.6 % (ref 36.0–46.0)
Hemoglobin: 13.2 g/dL (ref 12.0–15.0)
Lymphocytes Relative: 32.1 % (ref 12.0–46.0)
Lymphs Abs: 2.5 10*3/uL (ref 0.7–4.0)
MCHC: 32.5 g/dL (ref 30.0–36.0)
MCV: 92.4 fl (ref 78.0–100.0)
Monocytes Absolute: 0.4 10*3/uL (ref 0.1–1.0)
Monocytes Relative: 5.8 % (ref 3.0–12.0)
Neutro Abs: 4.5 10*3/uL (ref 1.4–7.7)
Neutrophils Relative %: 58.7 % (ref 43.0–77.0)
Platelets: 283 10*3/uL (ref 150.0–400.0)
RBC: 4.4 Mil/uL (ref 3.87–5.11)
RDW: 14.5 % (ref 11.5–15.5)
WBC: 7.7 10*3/uL (ref 4.0–10.5)

## 2022-05-13 LAB — COMPREHENSIVE METABOLIC PANEL
ALT: 9 U/L (ref 0–35)
AST: 15 U/L (ref 0–37)
Albumin: 4.5 g/dL (ref 3.5–5.2)
Alkaline Phosphatase: 120 U/L — ABNORMAL HIGH (ref 39–117)
BUN: 18 mg/dL (ref 6–23)
CO2: 29 mEq/L (ref 19–32)
Calcium: 10 mg/dL (ref 8.4–10.5)
Chloride: 105 mEq/L (ref 96–112)
Creatinine, Ser: 0.76 mg/dL (ref 0.40–1.20)
GFR: 87.51 mL/min (ref >60.00)
Glucose, Bld: 89 mg/dL (ref 70–99)
Potassium: 4.2 mEq/L (ref 3.5–5.1)
Sodium: 141 mEq/L (ref 135–145)
Total Bilirubin: 0.4 mg/dL (ref 0.2–1.2)
Total Protein: 7.4 g/dL (ref 6.0–8.3)

## 2022-05-13 LAB — LIPID PANEL
Cholesterol: 276 mg/dL — ABNORMAL HIGH (ref 0–200)
HDL: 69.4 mg/dL (ref >39.00)
LDL Cholesterol: 183 mg/dL — ABNORMAL HIGH (ref 0–99)
NonHDL: 206.59
Total CHOL/HDL Ratio: 4
Triglycerides: 116 mg/dL (ref 0.0–149.0)
VLDL: 23.2 mg/dL (ref 0.0–40.0)

## 2022-05-13 LAB — HEMOGLOBIN A1C: Hgb A1c MFr Bld: 6.3 % (ref 4.6–6.5)

## 2022-05-13 NOTE — Telephone Encounter (Signed)
Covering for Audrey Long who is on vacation.  Reviewed her cholesterol and it is quite high.  I would recommend that she begin atorvastatin '20mg'$  once daily to decrease her risk of heart attack and stroke if she is willing.  She should repeat lipid panel in 6 weeks if she does begin the atorvastatin.

## 2022-05-14 NOTE — Telephone Encounter (Signed)
Opened in error

## 2022-06-30 LAB — HM MAMMOGRAPHY

## 2022-07-02 ENCOUNTER — Encounter: Payer: Self-pay | Admitting: Family

## 2022-10-24 IMAGING — MR MR ABDOMEN WO/W CM
11 of 17 series · 30 of 48 positions shown · IV contrast (GADAVIST)
Comparison: CT June 02, 2021

CLINICAL DATA: Characterization of hepatic lesions seen on prior
cardiac CT.

EXAM:
MRI ABDOMEN WITHOUT AND WITH CONTRAST
TECHNIQUE: Multiplanar multisequence MR imaging of the abdomen was performed
both before and after the administration of intravenous contrast.
CONTRAST:  6mL GADAVIST GADOBUTROL 1 MMOL/ML IV SOLN

[Series 3: T2 · coronal · 7.0mm · 1.56mm/px · 2 of 33 slices shown (1 of 2)]
[im 1/33]
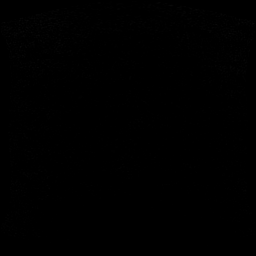
[im 33/33]
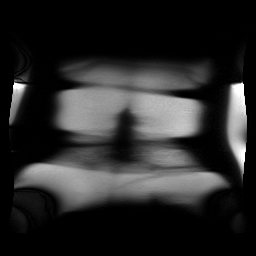

[Series 4: T2 fat-sat · axial · 6.0mm · 1.48mm/px · z∈[-74,+204]mm · 2 of 38 slices shown]
[im 1/38]
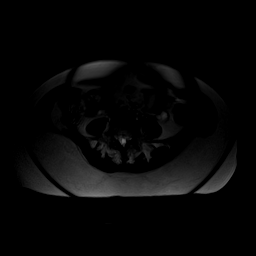
[im 38/38]
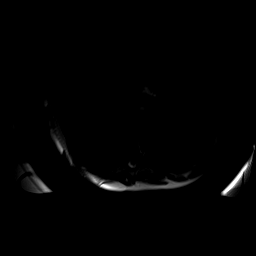

[Series 5: acr-in + out · axial · 6.0mm · 0.74mm/px · z∈[-101,+162]mm · 3 of 72 slices shown]
[im 1/72]
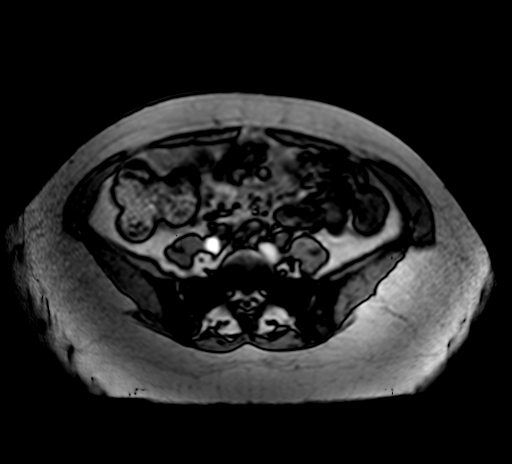
[im 36/72]
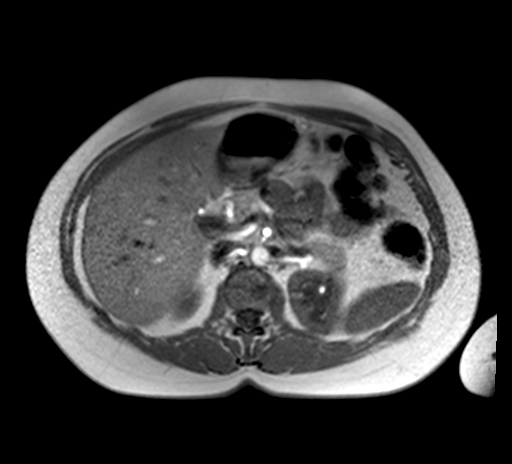
[im 72/72]
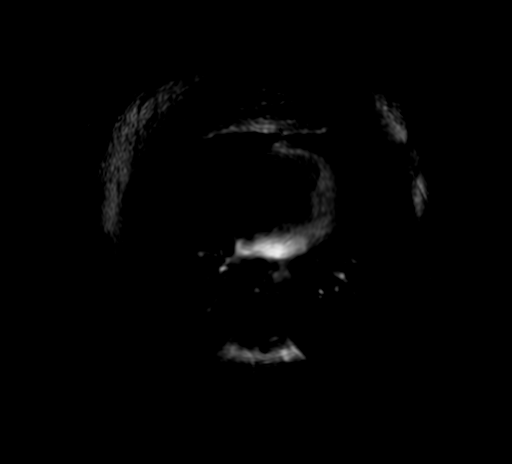

[Series 6: ep2d_diff_b50_500_800 free breathing · axial · 6.0mm · 1.98mm/px · z∈[-74,+204]mm · 5 of 114 slices shown]
[im 1/114]
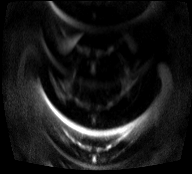
[im 29/114]
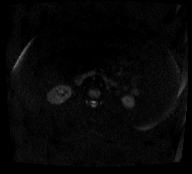
[im 57/114]
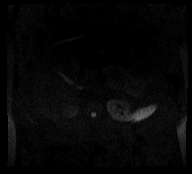
[im 85/114]
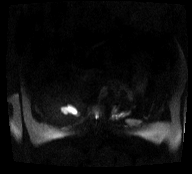
[im 114/114]
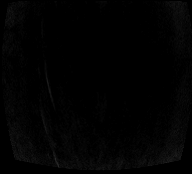

[Series 7: ep2d_diff_b50_500_800 free breathing_adc · axial · 6.0mm · 1.98mm/px · z∈[-74,+204]mm · 2 of 38 slices shown]
[im 1/38]
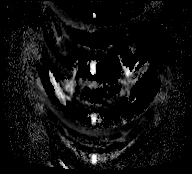
[im 38/38]
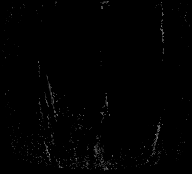

[Series 8: DWI · axial · 6.0mm · 0.74mm/px · z∈[-101,+162]mm · 2 of 36 slices shown]
[im 1/36]
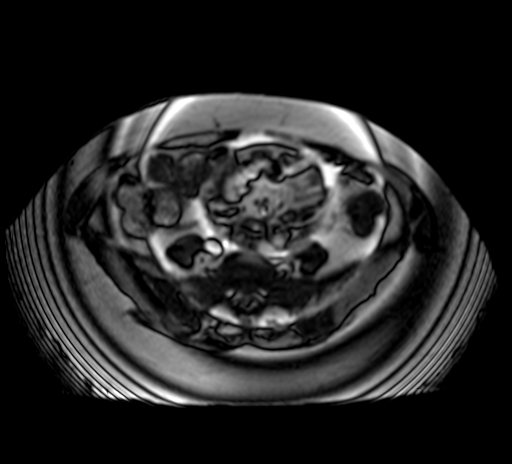
[im 36/36]
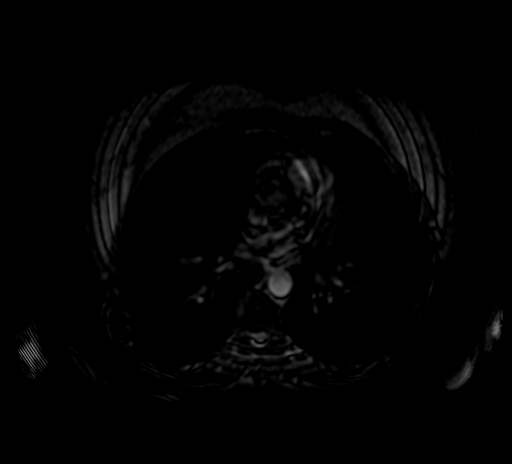

[Series 9: T2 · axial · 6.0mm · 1.48mm/px · z∈[-101,+162]mm · 2 of 36 slices shown (2 of 2)]
[im 1/36]
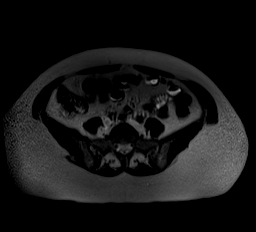
[im 36/36]
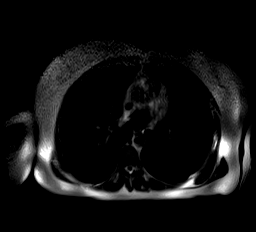

[Series 5003: sub_20 sec · axial · 4.0mm · 0.78mm/px · z∈[-112,+172]mm · 3 of 72 slices shown]
[im 1/72]
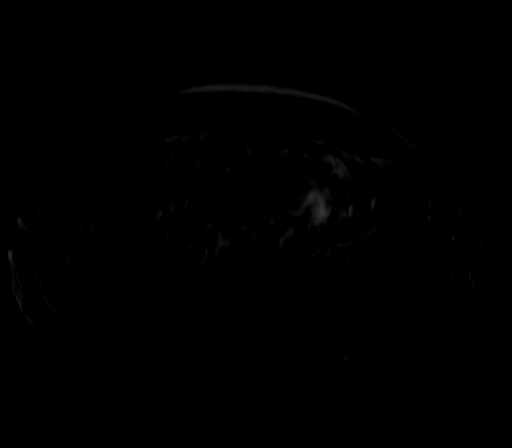
[im 36/72]
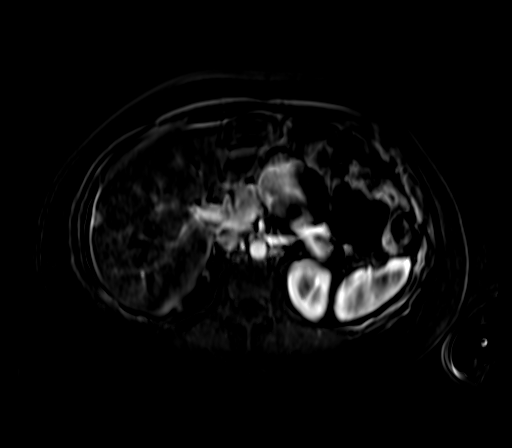
[im 72/72]
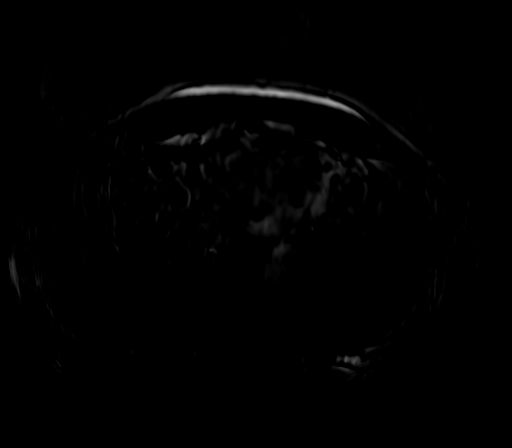

[Series 5004: sub_45 sec · axial · 4.0mm · 0.78mm/px · z∈[-112,+172]mm · 3 of 72 slices shown]
[im 1/72]
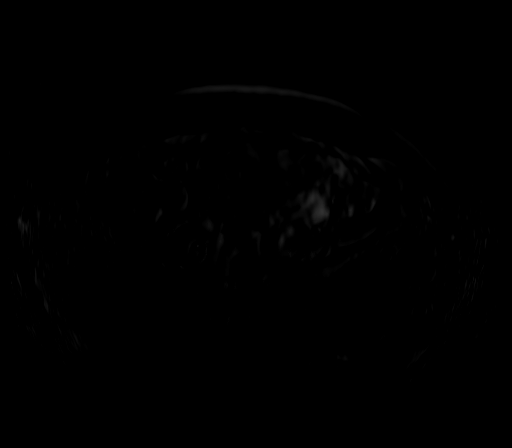
[im 36/72]
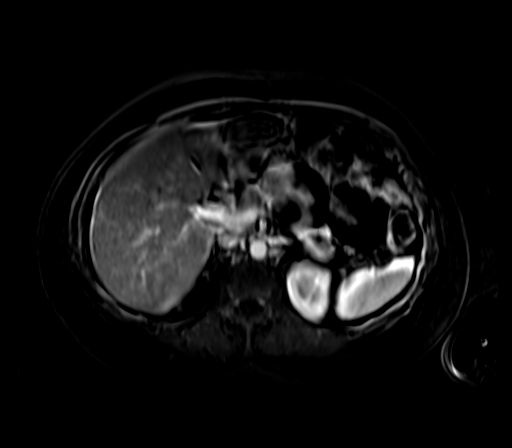
[im 72/72]
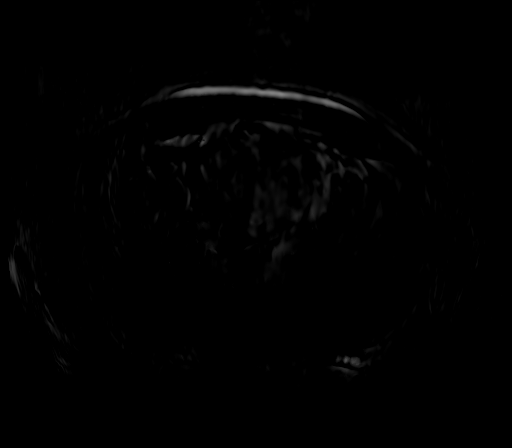

[Series 5005: sub_90 sec · axial · 4.0mm · 0.78mm/px · z∈[-112,+172]mm · 3 of 72 slices shown]
[im 1/72]
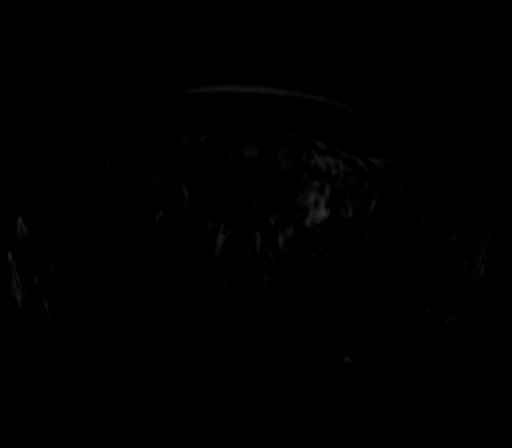
[im 36/72]
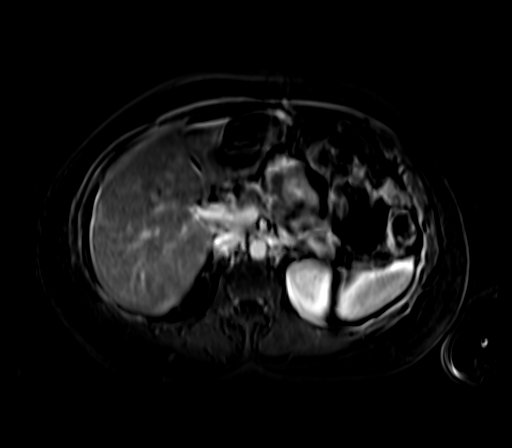
[im 72/72]
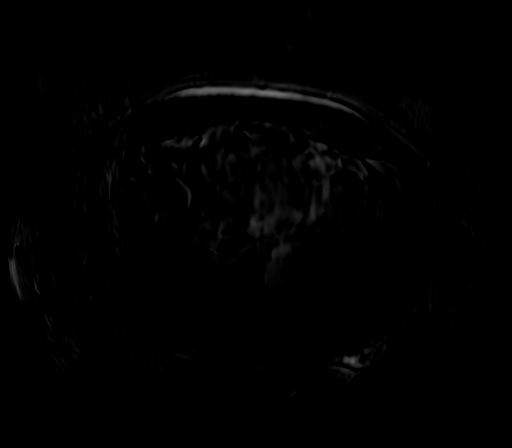

[Series 5006: sub_3 min · axial · 4.0mm · 0.78mm/px · z∈[-112,+172]mm · 3 of 72 slices shown]
[im 1/72]
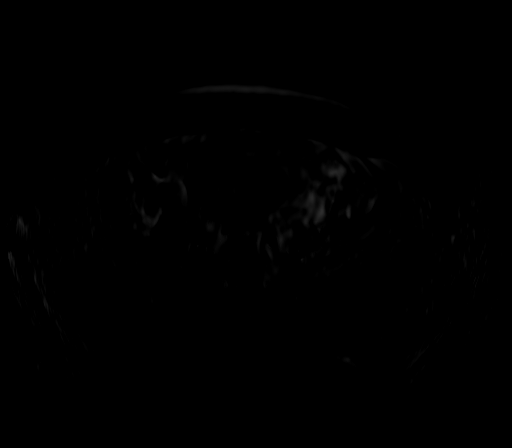
[im 36/72]
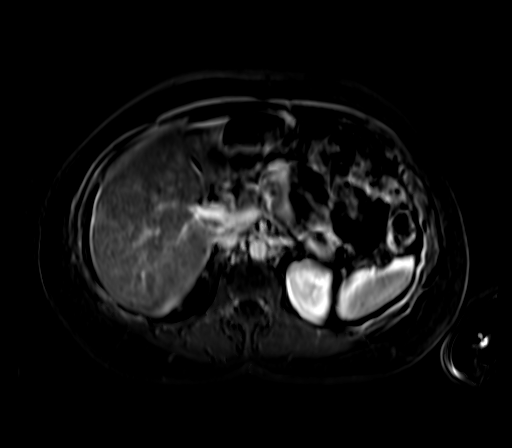
[im 72/72]
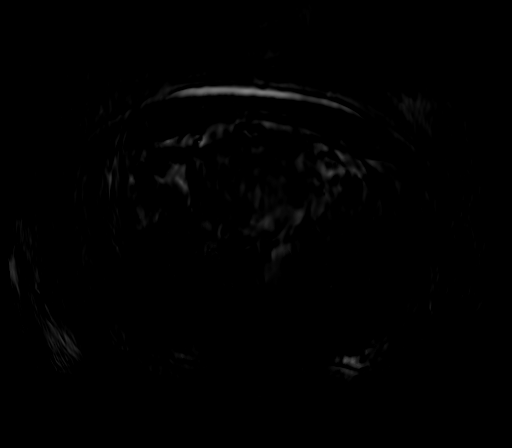

[30 of 48 positions shown; findings below may reference images not displayed]

FINDINGS: Despite efforts by the technologist and patient, motion artifact is
present on today's exam and could not be eliminated. This reduces
exam sensitivity and specificity.

Lower chest: No acute abnormality.

Hepatobiliary: Mild diffuse hepatic steatosis. Lobulated
well-circumscribed T2 hyperintense lesion in the hepatic dome
measuring 4.0 x 2.0 x 2.2 cm on image [DATE] and [DATE]. Lesion does not
demonstrate significant contrast uptake on early phases of
postcontrast imaging. However, on more delayed (90 second and 3
minutes) phase of postcontrast imaging there is peripheral nodular
discontinuous enhancement similar in intensity to aorta on images
21-26/15. There is a tiny adjacent 5 mm lesion on image [DATE] which
is difficult to characterize given small size and motion degraded
examination but which also appears to demonstrate similar imaging
characteristics. Subcentimeter cyst in the inferior aspect of the
right lobe of the liver.

Gallbladder is unremarkable.  No biliary ductal dilation.

Pancreas: Intrinsic T1 signal of the pancreatic parenchyma is within
normal limits. No pancreatic ductal dilation. No cystic or
arterially enhancing solid pancreatic lesions visualized.

Spleen:  Within normal limits.

Adrenals/Urinary Tract: No masses identified. No evidence of
hydronephrosis.

Stomach/Bowel: Visualized portions within the abdomen are
unremarkable.

Vascular/Lymphatic: No pathologically enlarged lymph nodes
identified. No abdominal aortic aneurysm demonstrated.

Other:  No abdominal ascites.

Musculoskeletal: No suspicious bone lesions identified.
IMPRESSION: 1. Lobulated well-circumscribed 4 cm T2 hyperintense lesion in the
hepatic dome, which does not demonstrate significant contrast uptake
on early phases of postcontrast imaging, but demonstrates peripheral
nodular discontinuous enhancement on more delayed sequences, most
consistent with a hepatic hemangioma. Additional tiny adjacent 5 mm
lesion which is difficult to characterize given small size and mild
motion degrading of the examination, but also likely reflecting a
hepatic hemangioma. Consider follow-up hepatic protocol MRI with and
without contrast in 6-12 months to ensure stability.
2. Mild diffuse hepatic steatosis.

## 2022-10-26 ENCOUNTER — Encounter: Payer: Self-pay | Admitting: Cardiology

## 2022-10-26 ENCOUNTER — Ambulatory Visit: Payer: PRIVATE HEALTH INSURANCE | Attending: Cardiology | Admitting: Cardiology

## 2022-10-26 VITALS — BP 120/70 | HR 86 | Ht 63.0 in | Wt 146.0 lb

## 2022-10-26 DIAGNOSIS — Z79899 Other long term (current) drug therapy: Secondary | ICD-10-CM | POA: Diagnosis not present

## 2022-10-26 DIAGNOSIS — Z0000 Encounter for general adult medical examination without abnormal findings: Secondary | ICD-10-CM | POA: Diagnosis not present

## 2022-10-26 DIAGNOSIS — E782 Mixed hyperlipidemia: Secondary | ICD-10-CM | POA: Diagnosis not present

## 2022-10-26 MED ORDER — ROSUVASTATIN CALCIUM 5 MG PO TABS: 5.0000 mg | ORAL_TABLET | Freq: Every day | ORAL | 3 refills | Status: DC

## 2022-10-26 NOTE — Patient Instructions (Signed)
Medication Instructions:  Your physician has recommended you make the following change in your medication:  START Crestor '5mg'$  tablet by mouth ONCE daily in the evening  *If you need a refill on your cardiac medications before your next appointment, please call your pharmacy*   Lab Work: Your physician recommends that you return for lab work in: Lipid/Liver in [place]!  If you have labs (blood work) drawn today and your tests are completely normal, you will receive your results only by: [facility] (if you have MyChart) OR A paper copy in the mail If you have any lab test that is abnormal or we need to change your treatment, we will call you to review the results.   Testing/Procedures: none   Follow-Up: At [facility], you and your health needs are our priority.  As part of our continuing mission to provide you with exceptional heart care, we have created designated Provider Care Teams.  These Care Teams include your primary Cardiologist (physician) and Advanced Practice Providers (APPs -  Physician Assistants and Nurse Practitioners) who all work together to provide you with the care you need, when you need it.  We recommend signing up for the patient portal called "MyChart".  Sign up information is provided on this After Visit Summary.  MyChart is used to connect with patients for Virtual Visits (Telemedicine).  Patients are able to view lab/test results, encounter notes, upcoming appointments, etc.  Non-urgent messages can be sent to your provider as well.   To learn more about what you can do with MyChart, go to [email or web address].    Your next appointment:   1 year(s)  The format for your next appointment:   In Person  Provider:   None     Other Instructions none  Important Information About Sugar

## 2022-10-26 NOTE — Progress Notes (Signed)
Cardiology Office Note:    Date:  10/26/2022   ID:  Audrey Long, DOB 1966/02/11, MRN [id number]  PCP:  Marrian Salvage, [place]  Cardiologist:  None  Electrophysiologist:  None   Referring MD: Marrian Salvage,*   No chief complaint on file.   " I want to get a baseline"  History of Present Illness:    Audrey Long is a 56 y.o. female with a hx of gestational hypertension, hyperlipidemia not on any statin lipid profile in February 2021 showed total cholesterol 286, HDL 77, LDL 185, premature history of coronary artery disease in her father is here today to for a follow up visit.   At her last visit , I send the patient for a coronary calcium score which came back 0 but there was evidence of hepatic lesion.    She has not had any chest pain or shortness of breath.   Past Medical History:  Diagnosis Date   Anxiety    Asthma    seasonal   Family history of premature CAD    Gestational diabetes    Hyperlipidemia     Past Surgical History:  Procedure Laterality Date   APPENDECTOMY  01/1983   COLONOSCOPY  02/21/2018   HERNIA REPAIR  06/1966    Current Medications: Current Meds  Medication Sig   albuterol (VENTOLIN HFA) 108 (90 Base) MCG/ACT inhaler Inhale 1-2 puffs into the lungs every 6 (six) hours as needed for wheezing or shortness of breath.   azelastine (ASTELIN) 0.1 % nasal spray Place 2 sprays into both nostrils 2 (two) times daily. Use in each nostril as directed   rosuvastatin (CRESTOR) 5 MG tablet Take 1 tablet (5 mg total) by mouth daily.   sertraline (ZOLOFT) 50 MG tablet Take 1 tablet (50 mg total) by mouth daily.     Allergies:   Patient has no known allergies.   Social History   Socioeconomic History   Marital status: Married    Spouse name: Not on file   Number of children: 2   Years of education: Not on file   Highest education level: Not on file  Occupational History   Occupation: Retired Pharmacist, hospital  Tobacco Use   Smoking status:  Never   Smokeless tobacco: Never  Vaping Use   Vaping Use: Never used  Substance and Sexual Activity   Alcohol use: Not Currently   Drug use: Never   Sexual activity: Not on file  Other Topics Concern   Not on file  Social History Narrative   Not on file   Social Determinants of Health   Financial Resource Strain: Not on file  Food Insecurity: Not on file  Transportation Needs: Not on file  Physical Activity: Not on file  Stress: Not on file  Social Connections: Not on file     Family History: The patient's family history includes CAD in her father. There is no history of Colon cancer, Esophageal cancer, Pancreatic cancer, Liver disease, or Stomach cancer.  ROS:   Review of Systems  Constitution: Negative for decreased appetite, fever and weight gain.  HENT: Negative for congestion, ear discharge, hoarse voice and sore throat.   Eyes: Negative for discharge, redness, vision loss in right eye and visual halos.  Cardiovascular: Negative for chest pain, dyspnea on exertion, leg swelling, orthopnea and palpitations.  Respiratory: Negative for cough, hemoptysis, shortness of breath and snoring.   Endocrine: Negative for heat intolerance and polyphagia.  Hematologic/Lymphatic: Negative for bleeding problem. Does not bruise/bleed easily.  Skin: Negative for flushing, nail changes, rash and suspicious lesions.  Musculoskeletal: Negative for arthritis, joint pain, muscle cramps, myalgias, neck pain and stiffness.  Gastrointestinal: Negative for abdominal pain, bowel incontinence, diarrhea and excessive appetite.  Genitourinary: Negative for decreased libido, genital sores and incomplete emptying.  Neurological: Negative for brief paralysis, focal weakness, headaches and loss of balance.  Psychiatric/Behavioral: Negative for altered mental status, depression and suicidal ideas.  Allergic/Immunologic: Negative for HIV exposure and persistent infections.    EKGs/Labs/Other Studies  Reviewed:    The following studies were reviewed today:   EKG:  None today   06/02/2022 ADDENDUM REPORT: 06/03/2021 09:57   CLINICAL DATA:  Cardiovascular Disease Risk stratification   EXAM: Coronary Calcium Score   TECHNIQUE: A gated, non-contrast computed tomography scan of the heart was performed using 28m slice thickness. Axial images were analyzed on a dedicated workstation. Calcium scoring of the coronary arteries was performed using the Agatston method.   FINDINGS: Coronary arteries: Normal origins.   Coronary Calcium Score:   Left main: 0   Left anterior descending artery: 0   Left circumflex artery: 0   Right coronary artery: 0   Total: 0   Percentile: 0   Pericardium: Normal.   Ascending Aorta: Normal caliber.   [facility]: [facility]   IMPRESSION: Coronary calcium score of 0. This was 0 percentile for age-, race-, and sex-matched controls.    TTE 06/02/2022 IMPRESSIONS     1. Left ventricular ejection fraction, by estimation, is 60 to 65%. The  left ventricle has normal function. The left ventricle has no regional  wall motion abnormalities. Left ventricular diastolic parameters were  normal.   2. Right ventricular systolic function is normal. The right ventricular  size is normal. There is normal pulmonary artery systolic pressure.   3. Left atrial size was mildly dilated.   4. The mitral valve is normal in structure. Trivial mitral valve  regurgitation. No evidence of mitral stenosis.   5. The aortic valve is tricuspid. Aortic valve regurgitation is not  visualized. No aortic stenosis is present.   6. The inferior vena cava is normal in size with greater than 50%  respiratory variability, suggesting right atrial pressure of 3 mmHg.   FINDINGS   Left Ventricle: Left ventricular ejection fraction, by estimation, is 60  to 65%. The left ventricle has normal function. The left ventricle has no  regional  wall motion abnormalities. The left ventricular internal cavity  size was normal in size. There is   no left ventricular hypertrophy. Left ventricular diastolic parameters  were normal.   Right Ventricle: The right ventricular size is normal. No increase in  right ventricular wall thickness. Right ventricular systolic function is  normal. There is normal pulmonary artery systolic pressure. The tricuspid  regurgitant velocity is 1.81 m/s, and   with an assumed right atrial pressure of 3 mmHg, the estimated right  ventricular systolic pressure is 158.8mmHg.   Left Atrium: Left atrial size was mildly dilated.   Right Atrium: Right atrial size was normal in size.   Pericardium: There is no evidence of pericardial effusion.   Mitral Valve: The mitral valve is normal in structure. Trivial mitral  valve regurgitation. No evidence of mitral valve stenosis.   Tricuspid Valve: The tricuspid valve is normal in structure. Tricuspid  valve regurgitation is trivial. No evidence of tricuspid stenosis.   Aortic Valve: The aortic valve is tricuspid. Aortic valve regurgitation is  not visualized. Aortic  regurgitation PHT measures 589 msec. No aortic  stenosis is present.   Pulmonic Valve: The pulmonic valve was normal in structure. Pulmonic valve  regurgitation is trivial. No evidence of pulmonic stenosis.   Aorta: The aortic root is normal in size and structure.   Venous: The inferior vena cava is normal in size with greater than 50%  respiratory variability, suggesting right atrial pressure of 3 mmHg.   IAS/Shunts: No atrial level shunt detected by color flow Doppler.    Recent Labs: 05/13/2022: ALT 9; BUN 18; Creatinine, Ser 0.76; Hemoglobin 13.2; Platelets 283.0; Potassium 4.2; Sodium 141  Recent Lipid Panel    Component Value Date/Time   CHOL 276 (H) 05/13/2022 1021   TRIG 116.0 05/13/2022 1021   HDL 69.40 05/13/2022 1021   CHOLHDL 4 05/13/2022 1021   VLDL 23.2 05/13/2022 1021    LDLCALC 183 (H) 05/13/2022 1021    Physical Exam:    VS:  BP 120/70   Pulse 86   Ht '5\' 3"'$  (1.6 m)   Wt 146 lb (66.2 kg)   SpO2 97%   BMI 25.86 kg/m     Wt Readings from Last 3 Encounters:  10/26/22 146 lb (66.2 kg)  07/18/21 153 lb 9.6 oz (69.7 kg)  07/18/21 153 lb (69.4 kg)     GEN: Well nourished, well developed in no acute distress HEENT: Normal NECK: No JVD; No carotid bruits LYMPHATICS: No lymphadenopathy CARDIAC: S1S2 noted,RRR, no murmurs, rubs, gallops RESPIRATORY:  Clear to auscultation without rales, wheezing or rhonchi  ABDOMEN: Soft, non-tender, non-distended, +bowel sounds, no guarding. EXTREMITIES: No edema, No cyanosis, no clubbing MUSCULOSKELETAL:  No deformity  SKIN: Warm and dry NEUROLOGIC:  Alert and oriented x 3, non-focal PSYCHIATRIC:  Normal affect, good insight  ASSESSMENT:    1. Medication management   2. Annual physical exam   3. Mixed hyperlipidemia     PLAN:     Coronary calcium with score of 0. We reviewed her lipid profile with evidence of elevated LDL. She will benefit from low intensity statin - will start crestor 5 mg daily.  Repeat LCL in 12 weeks with LFTS.  Echo was normal with no evidence of structural abnormalities.   The patient is in agreement with the above plan. The patient left the office in stable condition.  The patient will follow up in 1 year or sooner if needed.   Medication Adjustments/Labs and Tests Ordered: Current medicines are reviewed at length with the patient today.  Concerns regarding medicines are outlined above.  Orders Placed This Encounter  Procedures   Lipid panel   Hepatic function panel   EKG 12-Lead   Meds ordered this encounter  Medications   rosuvastatin (CRESTOR) 5 MG tablet    Sig: Take 1 tablet (5 mg total) by mouth daily.    Dispense:  90 tablet    Refill:  3     Patient Instructions  Medication Instructions:  Your physician has recommended you make the following change in your  medication:  START Crestor '5mg'$  tablet by mouth ONCE daily in the evening  *If you need a refill on your cardiac medications before your next appointment, please call your pharmacy*   Lab Work: Your physician recommends that you return for lab work in: Lipid/Liver in [place]!  If you have labs (blood work) drawn today and your tests are completely normal, you will receive your results only by: [place] (if you have MyChart) OR A paper copy in  the mail If you have any lab test that is abnormal or we need to change your treatment, we will call you to review the results.   Testing/Procedures: none   Follow-Up: At [facility], you and your health needs are our priority.  As part of our continuing mission to provide you with exceptional heart care, we have created designated Provider Care Teams.  These Care Teams include your primary Cardiologist (physician) and Advanced Practice Providers (APPs -  Physician Assistants and Nurse Practitioners) who all work together to provide you with the care you need, when you need it.  We recommend signing up for the patient portal called "MyChart".  Sign up information is provided on this After Visit Summary.  MyChart is used to connect with patients for Virtual Visits (Telemedicine).  Patients are able to view lab/test results, encounter notes, upcoming appointments, etc.  Non-urgent messages can be sent to your provider as well.   To learn more about what you can do with MyChart, go to [email or web address].    Your next appointment:   1 year(s)  The format for your next appointment:   In Person  Provider:   None     Other Instructions none  Important Information About Sugar         Adopting a Healthy Lifestyle.  Know what a healthy weight is for you (roughly BMI <25) and aim to maintain this   Aim for 7+ servings of fruits and vegetables daily   65-80+ fluid ounces of water or unsweet tea for healthy  kidneys   Limit to max 1 drink of alcohol per day; avoid smoking/tobacco   Limit animal fats in diet for cholesterol and heart health - choose grass fed whenever available   Avoid highly processed foods, and foods high in saturated/trans fats   Aim for low stress - take time to unwind and care for your mental health   Aim for 150 min of moderate intensity exercise weekly for heart health, and weights twice weekly for bone health   Aim for 7-9 hours of sleep daily   When it comes to diets, agreement about the perfect plan isnt easy to find, even among the experts. Experts at the [employer] developed an idea known as the Healthy Eating Plate. Just imagine a plate divided into logical, healthy portions.   The emphasis is on diet quality:   Load up on vegetables and fruits - one-half of your plate: Aim for color and variety, and remember that potatoes dont count.   Go for whole grains - one-quarter of your plate: Whole wheat, barley, wheat berries, quinoa, oats, brown rice, and foods made with them. If you want pasta, go with whole wheat pasta.   Protein power - one-quarter of your plate: Fish, chicken, beans, and nuts are all healthy, versatile protein sources. Limit red meat.   The diet, however, does go beyond the plate, offering a few other suggestions.   Use healthy plant oils, such as olive, canola, soy, corn, sunflower and peanut. Check the labels, and avoid partially hydrogenated oil, which have unhealthy trans fats.   If youre thirsty, drink water. Coffee and tea are good in moderation, but skip sugary drinks and limit milk and dairy products to one or two daily servings.   The type of carbohydrate in the diet is more important than the amount. Some sources of carbohydrates, such as vegetables, fruits, whole grains, and beans-are healthier than others.   Finally, stay  active  Rolly Pancake, DO  10/26/2022 10:23 PM    [facility]

## 2023-03-05 DIAGNOSIS — Z79899 Other long term (current) drug therapy: Secondary | ICD-10-CM | POA: Diagnosis not present

## 2023-03-05 DIAGNOSIS — Z0000 Encounter for general adult medical examination without abnormal findings: Secondary | ICD-10-CM | POA: Diagnosis not present

## 2023-03-06 LAB — HEPATIC FUNCTION PANEL
ALT: 13 IU/L (ref 0–32)
AST: 20 IU/L (ref 0–40)
Albumin: 4.5 g/dL (ref 3.8–4.9)
Alkaline Phosphatase: 129 IU/L — ABNORMAL HIGH (ref 44–121)
Bilirubin Total: 0.3 mg/dL (ref 0.0–1.2)
Bilirubin, Direct: <0.1 mg/dL (ref 0.00–0.40)
Total Protein: 7.1 g/dL (ref 6.0–8.5)

## 2023-03-06 LAB — LIPID PANEL
Chol/HDL Ratio: 2.5 ratio (ref 0.0–4.4)
Cholesterol, Total: 186 mg/dL (ref 100–199)
HDL: 74 mg/dL (ref >39)
LDL Chol Calc (NIH): 95 mg/dL (ref 0–99)
Triglycerides: 93 mg/dL (ref 0–149)
VLDL Cholesterol Cal: 17 mg/dL (ref 5–40)

## 2023-03-09 ENCOUNTER — Telehealth: Payer: Self-pay | Admitting: Cardiology

## 2023-03-09 NOTE — Telephone Encounter (Signed)
Patient is aware there will be no changes in medications at this time per Dr. Servando Salina. Her labs are stable. Verbalized understanding

## 2023-03-09 NOTE — Telephone Encounter (Signed)
Patient calling in to speak directly with Dr. Servando Salina about her test results and the next step. Please advise

## 2023-03-09 NOTE — Telephone Encounter (Signed)
Patient would like to know if you have recommendations from lab results. Please advise

## 2023-05-28 ENCOUNTER — Telehealth: Payer: Self-pay | Admitting: Family

## 2023-05-28 ENCOUNTER — Other Ambulatory Visit: Payer: Self-pay | Admitting: Family

## 2023-05-28 MED ORDER — SERTRALINE HCL 50 MG PO TABS: 75.0000 mg | ORAL_TABLET | Freq: Every day | ORAL | 0 refills | Status: DC

## 2023-05-28 NOTE — Telephone Encounter (Signed)
Pharmacy confirms that this was the Rx given May 2023 by this office.

## 2023-05-28 NOTE — Telephone Encounter (Signed)
Pts last visit with you per epic was May 2023. When I called the pt she said that she had been in since then, and that increasing her dose was discussed. She has been taking 75 mg (1.5 tab) since May. I verified her birthday. She looked at her med bottle and the last bottle had your name on it, but was dated from May 2023.  Unsure of how to move forward. Pt has no pending apts. Please advise.

## 2023-05-28 NOTE — Telephone Encounter (Signed)
Patient called regarding the medication   sertraline (ZOLOFT) 50 MG table. Patient states there was an increase in dosage but needs new script sent to [facility] on [address].

## 2023-05-28 NOTE — Telephone Encounter (Signed)
Apt has been scheduled for 06/29/23.

## 2023-06-25 ENCOUNTER — Other Ambulatory Visit: Payer: Self-pay | Admitting: Family

## 2023-06-29 ENCOUNTER — Encounter: Payer: Self-pay | Admitting: Family

## 2023-06-29 ENCOUNTER — Ambulatory Visit: Payer: 59 | Admitting: Family

## 2023-06-29 VITALS — BP 106/74 | HR 58 | Ht 63.0 in | Wt 141.2 lb

## 2023-06-29 DIAGNOSIS — E559 Vitamin D deficiency, unspecified: Secondary | ICD-10-CM

## 2023-06-29 DIAGNOSIS — R5383 Other fatigue: Secondary | ICD-10-CM | POA: Diagnosis not present

## 2023-06-29 DIAGNOSIS — R7309 Other abnormal glucose: Secondary | ICD-10-CM | POA: Diagnosis not present

## 2023-06-29 DIAGNOSIS — R7989 Other specified abnormal findings of blood chemistry: Secondary | ICD-10-CM | POA: Diagnosis not present

## 2023-06-29 LAB — COMPREHENSIVE METABOLIC PANEL
ALT: 10 U/L (ref 0–35)
AST: 17 U/L (ref 0–37)
Albumin: 4.6 g/dL (ref 3.5–5.2)
Alkaline Phosphatase: 102 U/L (ref 39–117)
BUN: 22 mg/dL (ref 6–23)
CO2: 27 mEq/L (ref 19–32)
Calcium: 9.7 mg/dL (ref 8.4–10.5)
Chloride: 105 mEq/L (ref 96–112)
Creatinine, Ser: 0.68 mg/dL (ref 0.40–1.20)
GFR: 96.5 mL/min (ref >60.00)
Glucose, Bld: 90 mg/dL (ref 70–99)
Potassium: 4.2 mEq/L (ref 3.5–5.1)
Sodium: 140 mEq/L (ref 135–145)
Total Bilirubin: 0.4 mg/dL (ref 0.2–1.2)
Total Protein: 7.1 g/dL (ref 6.0–8.3)

## 2023-06-29 LAB — CBC WITH DIFFERENTIAL/PLATELET
Basophils Absolute: 0 10*3/uL (ref 0.0–0.1)
Basophils Relative: 0.4 % (ref 0.0–3.0)
Eosinophils Absolute: 0.2 10*3/uL (ref 0.0–0.7)
Eosinophils Relative: 2.7 % (ref 0.0–5.0)
HCT: 41.9 % (ref 36.0–46.0)
Hemoglobin: 13.5 g/dL (ref 12.0–15.0)
Lymphocytes Relative: 30.2 % (ref 12.0–46.0)
Lymphs Abs: 2.2 10*3/uL (ref 0.7–4.0)
MCHC: 32.2 g/dL (ref 30.0–36.0)
MCV: 94.5 fl (ref 78.0–100.0)
Monocytes Absolute: 0.5 10*3/uL (ref 0.1–1.0)
Monocytes Relative: 6.4 % (ref 3.0–12.0)
Neutro Abs: 4.3 10*3/uL (ref 1.4–7.7)
Neutrophils Relative %: 60.3 % (ref 43.0–77.0)
Platelets: 267 10*3/uL (ref 150.0–400.0)
RBC: 4.44 Mil/uL (ref 3.87–5.11)
RDW: 14.1 % (ref 11.5–15.5)
WBC: 7.2 10*3/uL (ref 4.0–10.5)

## 2023-06-29 LAB — TSH: TSH: 1.15 u[IU]/mL (ref 0.35–5.50)

## 2023-06-29 LAB — VITAMIN B12: Vitamin B-12: 369 pg/mL (ref 211–911)

## 2023-06-29 LAB — VITAMIN D 25 HYDROXY (VIT D DEFICIENCY, FRACTURES): VITD: 30.06 ng/mL (ref 30.00–100.00)

## 2023-06-29 LAB — HEMOGLOBIN A1C: Hgb A1c MFr Bld: 6 % (ref 4.6–6.5)

## 2023-06-29 MED ORDER — SERTRALINE HCL 50 MG PO TABS: 75.0000 mg | ORAL_TABLET | Freq: Every day | ORAL | 3 refills | Status: DC

## 2023-06-29 NOTE — Progress Notes (Signed)
Audrey Long is a 57 y.o. 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 18 g 1   azelastine (ASTELIN) 0.1 % nasal spray Place 2 sprays into both nostrils 2 (two) times daily. Use in each nostril as directed 30 mL 1   rosuvastatin (CRESTOR) 5 MG tablet Take 1 tablet (5 mg total) by mouth daily. 90 tablet 3   sertraline (ZOLOFT) 50 MG tablet Take 1.5 tablets (75 mg total) by mouth daily. 135 tablet 3   No current facility-administered medications for this visit.    Allergies: Patient has no known allergies.  Past Medical History:  Diagnosis Date   Anxiety    Asthma    seasonal   Family history of premature CAD    Gestational diabetes    Hyperlipidemia     Past Surgical History:  Procedure Laterality Date   APPENDECTOMY  01/1983   COLONOSCOPY  02/21/2018   HERNIA REPAIR  06/1966    Family History  Problem Relation Age of Onset   CAD Father    Colon cancer Neg Hx    Esophageal cancer Neg Hx    Pancreatic cancer Neg Hx    Liver disease Neg Hx    Stomach cancer Neg Hx     Social History   Tobacco Use   Smoking status: Never   Smokeless tobacco: Never  Substance Use Topics   Alcohol use: Not Currently    Subjective:   Patient had increased her Zoloft to 75 mg- notes that symptoms of anxiety are improved; still concerned about difficulty focusing/ attention; "feeling fog brain/ lack of motivation" Admits that husband now working from home/ she works from home as well and finding a new rhythm to work together.   LMP 2019  Scheduled to see her GYN later this year;    Objective:  Vitals:   06/29/23 0944  BP: 106/74   Pulse: (!) 58  SpO2: 100%  Weight: 141 lb 3.2 oz (64 kg)  Height: 5\' 3"  (1.6 m)    General: Well developed, well nourished, in no acute distress  Skin : Warm and dry.  Head: Normocephalic and atraumatic  Eyes: Sclera and conjunctiva clear; pupils round and reactive to light; extraocular movements intact  Ears: External normal; canals clear; tympanic membranes normal  Oropharynx: Pink, supple. No suspicious lesions  Neck: Supple without thyromegaly, adenopathy  Lungs: Respirations unlabored; clear to auscultation bilaterally without wheeze, rales, rhonchi  CVS exam: normal rate and regular rhythm.  Neurologic: Alert and oriented; speech intact; face symmetrical; moves all extremities well; CNII-XII intact without focal deficit   Assessment:  1. Other fatigue   2. Elevated glucose   3. Low vitamin B12 level   4. Vitamin D deficiency     Plan:  Doing well on current dosage of Zoloft- refill updated; labs updated as requested- follow up to be determined;   No follow-ups on file.  Orders Placed This Encounter  Procedures   CBC with Differential/Platelet   Comp Met (CMET)   TSH   Hemoglobin A1c   B12   Vitamin D (25 hydroxy)    Requested Prescriptions   Signed Prescriptions  Disp Refills   sertraline (ZOLOFT) 50 MG tablet 135 tablet 3    Sig: Take 1.5 tablets (75 mg total) by mouth daily.

## 2023-09-17 ENCOUNTER — Telehealth: Payer: Self-pay | Admitting: Family

## 2023-09-17 MED ORDER — ALBUTEROL SULFATE HFA 108 (90 BASE) MCG/ACT IN AERS: 1.0000 | INHALATION_SPRAY | Freq: Four times a day (QID) | RESPIRATORY_TRACT | 1 refills | Status: AC | PRN

## 2023-09-17 NOTE — Addendum Note (Signed)
Addended by: Judieth Keens on: 09/17/2023 11:29 AM   Modules accepted: Orders

## 2023-09-17 NOTE — Telephone Encounter (Signed)
Rx sent in

## 2023-09-17 NOTE — Telephone Encounter (Signed)
Prescription Request  09/17/2023  Is this a "Controlled Substance" medicine? No  LOV: 06/29/2023  What is the name of the medication or equipment?   albuterol (VENTOLIN HFA) 108 (90 Base) MCG/ACT inhaler [[id number]]  Have you contacted your pharmacy to request a refill? No   Which pharmacy would you like this sent to?  [facility] [id number] - [place], [place] - [address] [place] [address] Phone: [phone] Fax: [phone]    Patient notified that their request is being sent to the clinical staff for review and that they should receive a response within 2 business days.   Please advise at Mobile [phone] (mobile)

## 2023-10-23 ENCOUNTER — Other Ambulatory Visit: Payer: Self-pay | Admitting: Cardiology

## 2023-10-26 ENCOUNTER — Other Ambulatory Visit: Payer: Self-pay

## 2023-10-26 ENCOUNTER — Telehealth: Payer: Self-pay | Admitting: Cardiology

## 2023-10-26 MED ORDER — ROSUVASTATIN CALCIUM 5 MG PO TABS: 5.0000 mg | ORAL_TABLET | Freq: Every day | ORAL | 0 refills | Status: DC

## 2023-10-26 NOTE — Telephone Encounter (Signed)
*  STAT* If patient is at the pharmacy, call can be transferred to refill team.   1. Which medications need to be refilled? (please list name of each medication and dose if known) rosuvastatin (CRESTOR) 5 MG tablet   2. Which pharmacy/location (including street and city if local pharmacy) is medication to be sent to? [facility] [id number] - [place], [place] - [address]   3. Do they need a 30 day or 90 day supply? 90  Patient is out of medication, has appt on 12/11

## 2023-11-02 NOTE — Progress Notes (Unsigned)
  Cardiology Office Note   Date:  11/02/2023  ID:  Audrey Long, DOB [birth year], MRN [id number] PCP:  Olive Bass, FNP [facility] Cardiologist: Thomasene Ripple, DO  Reason for visit: 12 month follow-up  History of Present Illness    Audrey Long is a 57 y.o. female with a hx of gestational hypertension, hyperlipidemia, premature history of CAD in her father.  She last saw Dr. Servando Salina in December 2023.  She had no cardiac complaints.  Coronary calcium score was 0.  For elevated LDL, she was started on Crestor 5 mg daily.  Echo showed no structural abnormality.  Today, ***  Family history of premature CAD -Coronary calcium score 2022 = 0 -Unremarkable echo in 2022, EF 60-65%, no significant valve disease  Hypertension -*** -Goal BP is <130/80.  Recommend DASH diet (high in vegetables, fruits, low-fat dairy products, whole grains, poultry, fish, and nuts and low in sweets, sugar-sweetened beverages, and red meats), salt restriction and increase physical activity.  Hyperlipidemia -*** -Recommend cholesterol lowering diets - Mediterranean diet, DASH diet, vegetarian diet, low-carbohydrate diet and avoidance of trans fats.  Discussed healthier choice substitutes.  Nuts, high-fiber foods, and fiber supplements may also improve lipids.    Obesity -Even a 5-10% weight loss can have cardiovascular benefits.   -Recommend moderate intensity activity for 30 minutes 5 days/week and the DASH diet.  Tobacco use  -Recommend tobacco cessation.  Reviewed physiologic effects of nicotine and the immediate-eventual benefits of quitting including improvement in cough/breathing and reduction in cardiovascular events.  Discussed quitting tips such as removing triggers and getting support from family/friends and Quitline . -USPSTF recommends one-time screening for abdominal aortic aneurysm (AAA) by ultrasound in men 22 -31 years old who have ever smoked.      Disposition - Follow-up in  ***     Objective / Physical Exam   EKG today: ***  Vital signs:  LMP 04/20/2018     GEN: No acute distress NECK: No carotid bruits CARDIAC: ***RRR, no murmurs RESPIRATORY:  Clear to auscultation without rales, wheezing or rhonchi  EXTREMITIES: No edema  Assessment and Plan   ***   {Are you ordering a CV Procedure (e.g. stress test, cath, DCCV, TEE, etc)?   Press F2        :[id number]}    Signed, Bernette Mayers  11/02/2023 [facility]

## 2023-11-03 ENCOUNTER — Ambulatory Visit: Payer: 59 | Admitting: Physician Assistant

## 2023-11-03 DIAGNOSIS — Z8249 Family history of ischemic heart disease and other diseases of the circulatory system: Secondary | ICD-10-CM

## 2023-11-03 DIAGNOSIS — Z79899 Other long term (current) drug therapy: Secondary | ICD-10-CM

## 2023-11-03 DIAGNOSIS — E782 Mixed hyperlipidemia: Secondary | ICD-10-CM

## 2023-11-05 ENCOUNTER — Encounter: Payer: Self-pay | Admitting: Nurse Practitioner

## 2023-11-05 ENCOUNTER — Ambulatory Visit: Payer: 59 | Attending: Nurse Practitioner | Admitting: Nurse Practitioner

## 2023-11-05 VITALS — BP 116/76 | HR 78 | Ht 63.0 in | Wt 147.2 lb

## 2023-11-05 DIAGNOSIS — Z8249 Family history of ischemic heart disease and other diseases of the circulatory system: Secondary | ICD-10-CM | POA: Diagnosis not present

## 2023-11-05 DIAGNOSIS — Z8632 Personal history of gestational diabetes: Secondary | ICD-10-CM | POA: Diagnosis not present

## 2023-11-05 DIAGNOSIS — E782 Mixed hyperlipidemia: Secondary | ICD-10-CM

## 2023-11-05 NOTE — Progress Notes (Signed)
Office Visit    Patient Name: Audrey Long Date of Encounter: 11/05/2023  Primary Care Provider:  Olive Bass, FNP Primary Cardiologist:  Thomasene Ripple, DO  Chief Complaint    57 year old female with a history of gestational diabetes, hyperlipidemia, and family history of premature CAD, who presents for follow-up related to hyperlipidemia.   Past Medical History    Past Medical History:  Diagnosis Date   Anxiety    Asthma    seasonal   Family history of premature CAD    Gestational diabetes    Hyperlipidemia    Past Surgical History:  Procedure Laterality Date   APPENDECTOMY  01/1983   COLONOSCOPY  02/21/2018   HERNIA REPAIR  06/1966    Allergies  No Known Allergies   Labs/Other Studies Reviewed    The following studies were reviewed today:  Cardiac Studies & Procedures      ECHOCARDIOGRAM  ECHOCARDIOGRAM COMPLETE 06/02/2021  Narrative ECHOCARDIOGRAM REPORT    Patient Name:   Audrey Long  Date of Exam: 06/02/2021 Medical Rec #:  [id number]     Height:       63.0 in Accession #:    [id number]    Weight:       152.0 lb Date of Birth:  1965/12/20    BSA:          1.721 m Patient Age:    54 years      BP:           108/70 mmHg Patient Gender: F             HR:           81 bpm. Exam Location:  [address]  Procedure: 2D Echo, 3D Echo, Cardiac Doppler and Color Doppler  Indications:    I51. 7 Left Atrial Enlargement  History:        Patient has no prior history of Echocardiogram examinations. CAD; Risk Factors:Dyslipidemia and Family History of Coronary Artery Disease.  Sonographer:    Farrel Conners RDCS Referring Phys: [id number] KARDIE TOBB  IMPRESSIONS   1. Left ventricular ejection fraction, by estimation, is 60 to 65%. The left ventricle has normal function. The left ventricle has no regional wall motion abnormalities. Left ventricular diastolic parameters were normal. 2. Right ventricular systolic function is normal. The right  ventricular size is normal. There is normal pulmonary artery systolic pressure. 3. Left atrial size was mildly dilated. 4. The mitral valve is normal in structure. Trivial mitral valve regurgitation. No evidence of mitral stenosis. 5. The aortic valve is tricuspid. Aortic valve regurgitation is not visualized. No aortic stenosis is present. 6. The inferior vena cava is normal in size with greater than 50% respiratory variability, suggesting right atrial pressure of 3 mmHg.  FINDINGS Left Ventricle: Left ventricular ejection fraction, by estimation, is 60 to 65%. The left ventricle has normal function. The left ventricle has no regional wall motion abnormalities. The left ventricular internal cavity size was normal in size. There is no left ventricular hypertrophy. Left ventricular diastolic parameters were normal.  Right Ventricle: The right ventricular size is normal. No increase in right ventricular wall thickness. Right ventricular systolic function is normal. There is normal pulmonary artery systolic pressure. The tricuspid regurgitant velocity is 1.81 m/s, and with an assumed right atrial pressure of 3 mmHg, the estimated right ventricular systolic pressure is 16.1 mmHg.  Left Atrium: Left atrial size was mildly dilated.  Right Atrium: Right atrial size was normal in size.  Pericardium: There is no evidence of pericardial effusion.  Mitral Valve: The mitral valve is normal in structure. Trivial mitral valve regurgitation. No evidence of mitral valve stenosis.  Tricuspid Valve: The tricuspid valve is normal in structure. Tricuspid valve regurgitation is trivial. No evidence of tricuspid stenosis.  Aortic Valve: The aortic valve is tricuspid. Aortic valve regurgitation is not visualized. Aortic regurgitation PHT measures 589 msec. No aortic stenosis is present.  Pulmonic Valve: The pulmonic valve was normal in structure. Pulmonic valve regurgitation is trivial. No evidence of pulmonic  stenosis.  Aorta: The aortic root is normal in size and structure.  Venous: The inferior vena cava is normal in size with greater than 50% respiratory variability, suggesting right atrial pressure of 3 mmHg.  IAS/Shunts: No atrial level shunt detected by color flow Doppler.   LEFT VENTRICLE PLAX 2D LVIDd:         3.80 cm  Diastology LVIDs:         2.30 cm  LV e' medial:    6.74 cm/s LV PW:         0.90 cm  LV E/e' medial:  8.4 LV IVS:        0.80 cm  LV e' lateral:   12.20 cm/s LVOT diam:     2.00 cm  LV E/e' lateral: 4.7 LV SV:         52 LV SV Index:   30 LVOT Area:     3.14 cm  3D Volume EF: 3D EF:        69 % LV EDV:       131 ml LV ESV:       41 ml LV SV:        90 ml  RIGHT VENTRICLE RV S prime:     10.40 cm/s TAPSE (M-mode): 1.8 cm  LEFT ATRIUM             Index       RIGHT ATRIUM           Index LA diam:        3.80 cm 2.21 cm/m  RA Area:     12.20 cm LA Vol (A2C):   62.6 ml 36.38 ml/m RA Volume:   27.50 ml  15.98 ml/m LA Vol (A4C):   39.2 ml 22.78 ml/m LA Biplane Vol: 53.5 ml 31.09 ml/m AORTIC VALVE LVOT Vmax:   90.00 cm/s LVOT Vmean:  54.300 cm/s LVOT VTI:    0.167 m AI PHT:      589 msec  AORTA Ao Root diam: 3.20 cm Ao Asc diam:  2.90 cm  MITRAL VALVE               TRICUSPID VALVE MV Area (PHT): cm         TR Peak grad:   13.1 mmHg MV Decel Time: 257 msec    TR Vmax:        181.00 cm/s MV E velocity: 56.90 cm/s MV A velocity: 70.30 cm/s  SHUNTS MV E/A ratio:  0.81        Systemic VTI:  0.17 m Systemic Diam: 2.00 cm  Charlton Haws MD Electronically signed by Charlton Haws MD Signature Date/Time: 06/02/2021/4:20:17 PM    Final    CT SCANS  CT CARDIAC SCORING (SELF PAY ONLY) 06/02/2021  Addendum 06/03/2021  9:59 AM ADDENDUM REPORT: 06/03/2021 09:57  CLINICAL DATA:  Cardiovascular Disease Risk stratification  EXAM: Coronary Calcium Score  TECHNIQUE: A gated, non-contrast computed tomography scan  of the heart was performed using 3mm  slice thickness. Axial images were analyzed on a dedicated workstation. Calcium scoring of the coronary arteries was performed using the Agatston method.  FINDINGS: Coronary arteries: Normal origins.  Coronary Calcium Score:  Left main: 0  Left anterior descending artery: 0  Left circumflex artery: 0  Right coronary artery: 0  Total: 0  Percentile: 0  Pericardium: Normal.  Ascending Aorta: Normal caliber.  Non-cardiac: See separate report from [facility].  IMPRESSION: Coronary calcium score of 0. This was 0 percentile for age-, race-, and sex-matched controls.  RECOMMENDATIONS: Coronary artery calcium (CAC) score is a strong predictor of incident coronary heart disease (CHD) and provides predictive information beyond traditional risk factors. CAC scoring is reasonable to use in the decision to withhold, postpone, or initiate statin therapy in intermediate-risk or selected borderline-risk asymptomatic adults (age 14-75 years and LDL-C >=70 to <190 mg/dL) who do not have diabetes or established atherosclerotic cardiovascular disease (ASCVD).* In intermediate-risk (10-year ASCVD risk >=7.5% to <20%) adults or selected borderline-risk (10-year ASCVD risk >=5% to <7.5%) adults in whom a CAC score is measured for the purpose of making a treatment decision the following recommendations have been made:  If CAC=0, it is reasonable to withhold statin therapy and reassess in 5 to 10 years, as long as higher risk conditions are absent (diabetes mellitus, family history of premature CHD in first degree relatives (males <55 years; females <65 years), cigarette smoking, or LDL >=190 mg/dL).  If CAC is 1 to 99, it is reasonable to initiate statin therapy for patients >=26 years of age.  If CAC is >=100 or >=75th percentile, it is reasonable to initiate statin therapy at any age.  Cardiology referral should be considered for patients with CAC scores >=400 or >=75th  percentile.  *2018 AHA/ACC/AACVPR/AAPA/ABC/ACPM/ADA/AGS/APhA/ASPC/NLA/PCNA Guideline on the Management of Blood Cholesterol: A Report of the American College of Cardiology/American Heart Association Task Force on Clinical Practice Guidelines. J Am Coll Cardiol. 2019;73(24):3168-3209.  Thomasene Ripple, DO   Electronically Signed By: Thomasene Ripple DO On: 06/03/2021 09:57  Narrative EXAM: OVER-READ INTERPRETATION  CT CHEST  The following report is an over-read performed by radiologist Dr. Jeronimo Greaves of [facility] on 06/03/2021. This over-read does not include interpretation of cardiac or coronary anatomy or pathology. The calcium score interpretation by the cardiologist is attached.  COMPARISON:  None.  FINDINGS: Vascular: Normal aortic caliber.  Mediastinum/Nodes: No imaged thoracic adenopathy.  Lungs/Pleura: No pleural fluid.  Clear imaged lungs.  Upper Abdomen: Suspect a high right hepatic lobe hypoattenuating 2.6 cm lesion on 32/4. Suboptimally evaluated.  Musculoskeletal: No acute osseous abnormality. Midthoracic spondylosis. Mild convex right thoracic spine curvature.  IMPRESSION: 1.  No acute findings in the imaged extracardiac chest. 2. Suspicion of a hepatic dome hypoattenuating lesion. If any history of primary malignancy or liver disease, recommend further evaluation with pre and post contrast abdominal MRI. If no such history, potential clinical strategies include right upper quadrant ultrasound versus more definitive characterization with MRI.  These results will be called to the ordering clinician or representative by the Radiologist Assistant, and communication documented in the PACS or [employer].  Electronically Signed: By: Jeronimo Greaves M.D. On: 06/03/2021 08:22         Recent Labs: 06/29/2023: ALT 10; BUN 22; Creatinine, Ser 0.68; Hemoglobin 13.5; Platelets 267.0; Potassium 4.2; Sodium 140; TSH 1.15  Recent Lipid Panel     Component Value Date/Time   CHOL 186 03/05/2023 0928  TRIG 93 03/05/2023 0928   HDL 74 03/05/2023 0928   CHOLHDL 2.5 03/05/2023 0928   CHOLHDL 4 05/13/2022 1021   VLDL 23.2 05/13/2022 1021   LDLCALC 95 03/05/2023 0928    History of Present Illness   57 year old female with the above past medical history including gestational diabetes, hyperlipidemia, and family history of premature CAD.  She has a family history of premature CAD in her father.  Echocardiogram in 2022 showed EF 60 to 65%, normal LV function, no RWMA, normal RV systolic function, no significant valvular abnormalities.  CT calcium scoring in 05/2021 revealed coronary calcium score of 0.  She was last seen in the office on 10/26/2022 and was stable from a cardiac standpoint.  She denied symptoms concerning for angina.  She presents today for follow-up.  Since her last visit she has done well from a cardiac standpoint.  She denies symptoms concerning for angina.  BP has been well-controlled.  Cholesterol is much improved with initiation of Crestor.  Overall, she reports feeling well.  Home Medications    Current Outpatient Medications  Medication Sig Dispense Refill   albuterol (VENTOLIN HFA) 108 (90 Base) MCG/ACT inhaler Inhale 1-2 puffs into the lungs every 6 (six) hours as needed for wheezing or shortness of breath. 18 g 1   azelastine (ASTELIN) 0.1 % nasal spray Place 2 sprays into both nostrils 2 (two) times daily. Use in each nostril as directed 30 mL 1   rosuvastatin (CRESTOR) 5 MG tablet Take 1 tablet (5 mg total) by mouth daily. KEEP OV. 90 tablet 0   sertraline (ZOLOFT) 50 MG tablet Take 1.5 tablets (75 mg total) by mouth daily. 135 tablet 3   No current facility-administered medications for this visit.     Review of Systems    She denies chest pain, palpitations, dyspnea, pnd, orthopnea, n, v, dizziness, syncope, edema, weight gain, or early satiety. All other systems reviewed and are otherwise negative except  as noted above.   Physical Exam    VS:  BP 116/76 (BP Location: Left Arm, Patient Position: Sitting)   Pulse 78   Ht 5\' 3"  (1.6 m)   Wt 147 lb 3.2 oz (66.8 kg)   LMP 04/20/2018   SpO2 96%   BMI 26.08 kg/m   GEN: Well nourished, well developed, in no acute distress. HEENT: normal. Neck: Supple, no JVD, carotid bruits, or masses. Cardiac: RRR, no murmurs, rubs, or gallops. No clubbing, cyanosis, edema.  Radials/DP/PT 2+ and equal bilaterally.  Respiratory:  Respirations regular and unlabored, clear to auscultation bilaterally. GI: Soft, nontender, nondistended, BS + x 4. MS: no deformity or atrophy. Skin: warm and dry, no rash. Neuro:  Strength and sensation are intact. Psych: Normal affect.  Accessory Clinical Findings    ECG personally reviewed by me today - EKG Interpretation Date/Time:  Friday November 05 2023 09:32:32 EST Ventricular Rate:  76 PR Interval:  158 QRS Duration:  70 QT Interval:  392 QTC Calculation: 441 R Axis:   8  Text Interpretation: Normal sinus rhythm Normal ECG No previous ECGs available Confirmed by Bernadene Person ([id number]) on 11/05/2023 9:55:57 AM  - no acute changes.   Lab Results  Component Value Date   WBC 7.2 06/29/2023   HGB 13.5 06/29/2023   HCT 41.9 06/29/2023   MCV 94.5 06/29/2023   PLT 267.0 06/29/2023   Lab Results  Component Value Date   CREATININE 0.68 06/29/2023   BUN 22 06/29/2023   NA 140 06/29/2023  K 4.2 06/29/2023   CL 105 06/29/2023   CO2 27 06/29/2023   Lab Results  Component Value Date   ALT 10 06/29/2023   AST 17 06/29/2023   ALKPHOS 102 06/29/2023   BILITOT 0.4 06/29/2023   Lab Results  Component Value Date   CHOL 186 03/05/2023   HDL 74 03/05/2023   LDLCALC 95 03/05/2023   TRIG 93 03/05/2023   CHOLHDL 2.5 03/05/2023    Lab Results  Component Value Date   HGBA1C 6.0 06/29/2023    Assessment & Plan    1. Family history of premature CAD: Coronary calcium score 2022 was 0. Unremarkable echo in 2022,  EF 60-65%, no significant valve disease. Stable with no anginal symptoms. No indication for ischemic evaluation.  Continue Crestor.  2. Hyperlipidemia: LDL was 95 in 02/2023.  Frequently improved with addition of statin therapy.  Continue Crestor.  3.  History of gestational diabetes: A1c was 6.0 in 06/2023.  Monitored per PCP.  4. Disposition: Follow-up in 1 year.      Joylene Grapes, NP 11/05/2023, 10:15 AM

## 2023-11-05 NOTE — Patient Instructions (Signed)
Medication Instructions:  Your physician recommends that you continue on your current medications as directed. Please refer to the Current Medication list given to you today.  *If you need a refill on your cardiac medications before your next appointment, please call your pharmacy*   Lab Work: NONE ordered at this time of appointment     Testing/Procedures: NONE ordered at this time of appointment     Follow-Up: At [facility], you and your health needs are our priority.  As part of our continuing mission to provide you with exceptional heart care, we have created designated Provider Care Teams.  These Care Teams include your primary Cardiologist (physician) and Advanced Practice Providers (APPs -  Physician Assistants and Nurse Practitioners) who all work together to provide you with the care you need, when you need it.  We recommend signing up for the patient portal called "MyChart".  Sign up information is provided on this After Visit Summary.  MyChart is used to connect with patients for Virtual Visits (Telemedicine).  Patients are able to view lab/test results, encounter notes, upcoming appointments, etc.  Non-urgent messages can be sent to your provider as well.   To learn more about what you can do with MyChart, go to [email or web address].    Your next appointment:   1 year(s)  Provider:   Thomasene Ripple, DO     Other Instructions

## 2023-12-20 DIAGNOSIS — H33101 Unspecified retinoschisis, right eye: Secondary | ICD-10-CM | POA: Diagnosis not present

## 2023-12-20 DIAGNOSIS — H43391 Other vitreous opacities, right eye: Secondary | ICD-10-CM | POA: Diagnosis not present

## 2023-12-20 DIAGNOSIS — H43811 Vitreous degeneration, right eye: Secondary | ICD-10-CM | POA: Diagnosis not present

## 2024-01-05 DIAGNOSIS — H43391 Other vitreous opacities, right eye: Secondary | ICD-10-CM | POA: Diagnosis not present

## 2024-01-05 DIAGNOSIS — H33101 Unspecified retinoschisis, right eye: Secondary | ICD-10-CM | POA: Diagnosis not present

## 2024-01-05 DIAGNOSIS — H43811 Vitreous degeneration, right eye: Secondary | ICD-10-CM | POA: Diagnosis not present

## 2024-02-25 DIAGNOSIS — F341 Dysthymic disorder: Secondary | ICD-10-CM | POA: Diagnosis not present

## 2024-03-03 DIAGNOSIS — F341 Dysthymic disorder: Secondary | ICD-10-CM | POA: Diagnosis not present

## 2024-03-06 DIAGNOSIS — H43811 Vitreous degeneration, right eye: Secondary | ICD-10-CM | POA: Diagnosis not present

## 2024-03-06 DIAGNOSIS — H33321 Round hole, right eye: Secondary | ICD-10-CM | POA: Diagnosis not present

## 2024-03-06 DIAGNOSIS — H43391 Other vitreous opacities, right eye: Secondary | ICD-10-CM | POA: Diagnosis not present

## 2024-03-06 DIAGNOSIS — H35371 Puckering of macula, right eye: Secondary | ICD-10-CM | POA: Diagnosis not present

## 2024-03-10 DIAGNOSIS — F341 Dysthymic disorder: Secondary | ICD-10-CM | POA: Diagnosis not present

## 2024-03-17 DIAGNOSIS — F341 Dysthymic disorder: Secondary | ICD-10-CM | POA: Diagnosis not present

## 2024-03-21 DIAGNOSIS — F341 Dysthymic disorder: Secondary | ICD-10-CM | POA: Diagnosis not present

## 2024-03-23 DIAGNOSIS — F341 Dysthymic disorder: Secondary | ICD-10-CM | POA: Diagnosis not present

## 2024-03-30 DIAGNOSIS — F341 Dysthymic disorder: Secondary | ICD-10-CM | POA: Diagnosis not present

## 2024-04-06 DIAGNOSIS — F341 Dysthymic disorder: Secondary | ICD-10-CM | POA: Diagnosis not present

## 2024-04-13 DIAGNOSIS — Z01411 Encounter for gynecological examination (general) (routine) with abnormal findings: Secondary | ICD-10-CM | POA: Diagnosis not present

## 2024-04-13 DIAGNOSIS — Z1231 Encounter for screening mammogram for malignant neoplasm of breast: Secondary | ICD-10-CM | POA: Diagnosis not present

## 2024-04-13 DIAGNOSIS — Z1331 Encounter for screening for depression: Secondary | ICD-10-CM | POA: Diagnosis not present

## 2024-04-13 DIAGNOSIS — Z01419 Encounter for gynecological examination (general) (routine) without abnormal findings: Secondary | ICD-10-CM | POA: Diagnosis not present

## 2024-04-13 DIAGNOSIS — Z113 Encounter for screening for infections with a predominantly sexual mode of transmission: Secondary | ICD-10-CM | POA: Diagnosis not present

## 2024-04-13 DIAGNOSIS — Z124 Encounter for screening for malignant neoplasm of cervix: Secondary | ICD-10-CM | POA: Diagnosis not present

## 2024-04-13 DIAGNOSIS — F341 Dysthymic disorder: Secondary | ICD-10-CM | POA: Diagnosis not present

## 2024-04-13 LAB — HM MAMMOGRAPHY

## 2024-04-20 DIAGNOSIS — F341 Dysthymic disorder: Secondary | ICD-10-CM | POA: Diagnosis not present

## 2024-04-29 DIAGNOSIS — F341 Dysthymic disorder: Secondary | ICD-10-CM | POA: Diagnosis not present

## 2024-05-01 DIAGNOSIS — H43393 Other vitreous opacities, bilateral: Secondary | ICD-10-CM | POA: Diagnosis not present

## 2024-05-01 DIAGNOSIS — H5213 Myopia, bilateral: Secondary | ICD-10-CM | POA: Diagnosis not present

## 2024-05-01 DIAGNOSIS — H2513 Age-related nuclear cataract, bilateral: Secondary | ICD-10-CM | POA: Diagnosis not present

## 2024-05-01 DIAGNOSIS — H524 Presbyopia: Secondary | ICD-10-CM | POA: Diagnosis not present

## 2024-05-01 DIAGNOSIS — H25013 Cortical age-related cataract, bilateral: Secondary | ICD-10-CM | POA: Diagnosis not present

## 2024-05-01 DIAGNOSIS — H52203 Unspecified astigmatism, bilateral: Secondary | ICD-10-CM | POA: Diagnosis not present

## 2024-05-04 DIAGNOSIS — L304 Erythema intertrigo: Secondary | ICD-10-CM | POA: Diagnosis not present

## 2024-05-04 DIAGNOSIS — D229 Melanocytic nevi, unspecified: Secondary | ICD-10-CM | POA: Diagnosis not present

## 2024-05-06 DIAGNOSIS — F341 Dysthymic disorder: Secondary | ICD-10-CM | POA: Diagnosis not present

## 2024-05-11 DIAGNOSIS — H43393 Other vitreous opacities, bilateral: Secondary | ICD-10-CM | POA: Diagnosis not present

## 2024-05-11 DIAGNOSIS — H25013 Cortical age-related cataract, bilateral: Secondary | ICD-10-CM | POA: Diagnosis not present

## 2024-05-11 DIAGNOSIS — H5213 Myopia, bilateral: Secondary | ICD-10-CM | POA: Diagnosis not present

## 2024-05-11 DIAGNOSIS — H52203 Unspecified astigmatism, bilateral: Secondary | ICD-10-CM | POA: Diagnosis not present

## 2024-05-11 DIAGNOSIS — H524 Presbyopia: Secondary | ICD-10-CM | POA: Diagnosis not present

## 2024-05-11 DIAGNOSIS — H43811 Vitreous degeneration, right eye: Secondary | ICD-10-CM | POA: Diagnosis not present

## 2024-05-11 DIAGNOSIS — H2513 Age-related nuclear cataract, bilateral: Secondary | ICD-10-CM | POA: Diagnosis not present

## 2024-05-13 DIAGNOSIS — F341 Dysthymic disorder: Secondary | ICD-10-CM | POA: Diagnosis not present

## 2024-05-20 DIAGNOSIS — F341 Dysthymic disorder: Secondary | ICD-10-CM | POA: Diagnosis not present

## 2024-05-30 DIAGNOSIS — F341 Dysthymic disorder: Secondary | ICD-10-CM | POA: Diagnosis not present

## 2024-06-14 DIAGNOSIS — F341 Dysthymic disorder: Secondary | ICD-10-CM | POA: Diagnosis not present

## 2024-06-22 DIAGNOSIS — F341 Dysthymic disorder: Secondary | ICD-10-CM | POA: Diagnosis not present

## 2024-06-29 DIAGNOSIS — F341 Dysthymic disorder: Secondary | ICD-10-CM | POA: Diagnosis not present

## 2024-07-05 DIAGNOSIS — F341 Dysthymic disorder: Secondary | ICD-10-CM | POA: Diagnosis not present

## 2024-07-06 DIAGNOSIS — H5213 Myopia, bilateral: Secondary | ICD-10-CM | POA: Diagnosis not present

## 2024-07-06 DIAGNOSIS — H52203 Unspecified astigmatism, bilateral: Secondary | ICD-10-CM | POA: Diagnosis not present

## 2024-07-06 DIAGNOSIS — H43811 Vitreous degeneration, right eye: Secondary | ICD-10-CM | POA: Diagnosis not present

## 2024-07-06 DIAGNOSIS — H524 Presbyopia: Secondary | ICD-10-CM | POA: Diagnosis not present

## 2024-07-06 DIAGNOSIS — H2513 Age-related nuclear cataract, bilateral: Secondary | ICD-10-CM | POA: Diagnosis not present

## 2024-07-06 DIAGNOSIS — H25013 Cortical age-related cataract, bilateral: Secondary | ICD-10-CM | POA: Diagnosis not present

## 2024-07-13 ENCOUNTER — Other Ambulatory Visit: Payer: Self-pay | Admitting: Family

## 2024-08-03 ENCOUNTER — Telehealth: Payer: Self-pay | Admitting: Family

## 2024-08-03 ENCOUNTER — Ambulatory Visit: Payer: Self-pay | Admitting: Family

## 2024-08-03 MED ORDER — AZELASTINE HCL 0.1 % NA SOLN: 2.0000 | Freq: Two times a day (BID) | NASAL | 1 refills | Status: DC

## 2024-08-03 NOTE — Telephone Encounter (Unsigned)
 Copied from CRM #[id number]. Topic: Clinical - Medication Refill >> Aug 03, 2024 11:34 AM Dedra B wrote: Medication: azelastine  (ASTELIN ) 0.1 % nasal spray  Has the patient contacted their pharmacy? No, no refills   This is the patient's preferred pharmacy:  [id number] - [place], [place] - [address] [place] [address] Phone: [phone] Fax: [phone]  Is this the correct pharmacy for this prescription? Yes   Has the prescription been filled recently? No  Is the patient out of the medication? No  Has the patient been seen for an appointment in the last year OR does the patient have an upcoming appointment? Yes, 09/28/24  Can we respond through MyChart? Yes  Agent: Please be advised that Rx refills may take up to 3 business days. We ask that you follow-up with your pharmacy.

## 2024-08-03 NOTE — Telephone Encounter (Signed)
 Copied from CRM #[id number]. Topic: Clinical - Medication Refill >> Aug 03, 2024 11:34 AM Dedra B wrote: Medication: azelastine  (ASTELIN ) 0.1 % nasal spray  Has the patient contacted their pharmacy? No, no refills   This is the patient's preferred pharmacy:  [facility] [id number] - [place], [place] - [address] [place] [address] Phone: [phone] Fax: [phone]  Is this the correct pharmacy for this prescription? Yes   Has the prescription been filled recently? No  Is the patient out of the medication? No  Has the patient been seen for an appointment in the last year OR does the patient have an upcoming appointment? Yes, 09/28/24  Can we respond through MyChart? Yes  Agent: Please be advised that Rx refills may take up to 3 business days. We ask that you follow-up with your pharmacy.

## 2024-09-28 ENCOUNTER — Encounter: Payer: Self-pay | Admitting: Family Medicine

## 2024-09-28 ENCOUNTER — Ambulatory Visit: Admitting: Family Medicine

## 2024-09-28 VITALS — BP 111/72 | HR 88 | Ht 63.0 in | Wt 153.0 lb

## 2024-09-28 DIAGNOSIS — Z131 Encounter for screening for diabetes mellitus: Secondary | ICD-10-CM | POA: Diagnosis not present

## 2024-09-28 DIAGNOSIS — Z124 Encounter for screening for malignant neoplasm of cervix: Secondary | ICD-10-CM | POA: Diagnosis not present

## 2024-09-28 DIAGNOSIS — J309 Allergic rhinitis, unspecified: Secondary | ICD-10-CM | POA: Diagnosis not present

## 2024-09-28 DIAGNOSIS — Z8632 Personal history of gestational diabetes: Secondary | ICD-10-CM

## 2024-09-28 DIAGNOSIS — F419 Anxiety disorder, unspecified: Secondary | ICD-10-CM

## 2024-09-28 DIAGNOSIS — Z0000 Encounter for general adult medical examination without abnormal findings: Secondary | ICD-10-CM | POA: Diagnosis not present

## 2024-09-28 DIAGNOSIS — E559 Vitamin D deficiency, unspecified: Secondary | ICD-10-CM

## 2024-09-28 MED ORDER — SERTRALINE HCL 50 MG PO TABS: 75.0000 mg | ORAL_TABLET | Freq: Every day | ORAL | 0 refills | Status: DC

## 2024-09-28 MED ORDER — AZELASTINE HCL 0.1 % NA SOLN: 2.0000 | Freq: Two times a day (BID) | NASAL | 1 refills | Status: AC

## 2024-09-28 NOTE — Progress Notes (Signed)
 Complete physical exam  Patient: Audrey Long   DOB: [birth year]   58 y.o. Female  MRN: [id number]  Subjective:    Chief Complaint  Patient presents with   Establish Care    Audrey Long is a 58 y.o. female who presents today for a complete physical exam. She reports consuming a general diet. Minimal exercise. She generally feels well. She reports sleeping well. She does not have additional problems to discuss today.   Currently lives with: spouse Acute concerns or interim problems since last visit:   Vision concerns: no Dental concerns: no STD concerns:   ETOH use: no Nicotine use: no Recreational drugs/illegal substances: no  Anxiety: - Followed by Dr. Charlott with [facility] in the past - Treatment: Zoloft  75 mg daily - Medication side effects: no - SI/HI: no - Update: Stable.   Hyperlipidemia: - medications: Rosuvastatin  5 mg daily - compliance: good - medication SEs: no The 10-year ASCVD risk score (Arnett DK, et al., 2019) is: 1.5%   Values used to calculate the score:     Age: 53 years     Clincally relevant sex: Female     Is Non-Hispanic African American: No     Diabetic: No     Tobacco smoker: No     Systolic Blood Pressure: 111 mmHg     Is BP treated: No     HDL Cholesterol: 74 mg/dL     Total Cholesterol: 186 mg/dL  History of Gestational Diabetes: - Checking glucose at home: no - Medications: n/a - Compliance: n/a - Denies symptoms of hypoglycemia, polyuria, polydipsia, numbness extremities, foot ulcers/trauma, wounds that are not healing, medication side effects  Lab Results  Component Value Date   HGBA1C 6.0 06/29/2023    Seasonal allergies, asthma: - Albuterol  PRN, Asteline nasal spray    Most recent fall risk assessment:    09/28/2024    4:29 PM  Fall Risk   Falls in the past year? 0  Number falls in past yr: 0  Injury with Fall? 0  Risk for fall due to : No Fall Risks  Follow up Falls evaluation completed     Most  recent depression screenings:    09/28/2024    4:29 PM 04/14/2022   10:04 AM  PHQ 2/9 Scores  PHQ - 2 Score 1 0  PHQ- 9 Score 5 0      Data saved with a previous flowsheet row definition       09/28/2024    4:30 PM  GAD 7 : Generalized Anxiety Score  Nervous, Anxious, on Edge 1  Control/stop worrying 0  Worry too much - different things 1  Trouble relaxing 0  Restless 0  Easily annoyed or irritable 0  Afraid - awful might happen 1  Total GAD 7 Score 3  Anxiety Difficulty Not difficult at all            Patient Care Team: Almarie Waddell NOVAK, NP as PCP - General (Family Medicine) Tobb, Kardie, DO as PCP - Cardiology (Cardiology)   Outpatient Medications Prior to Visit  Medication Sig   albuterol  (VENTOLIN  HFA) 108 (90 Base) MCG/ACT inhaler Inhale 1-2 puffs into the lungs every 6 (six) hours as needed for wheezing or shortness of breath.   rosuvastatin  (CRESTOR ) 5 MG tablet Take 1 tablet (5 mg total) by mouth daily. KEEP OV.   [DISCONTINUED] azelastine  (ASTELIN ) 0.1 % nasal spray Place 2 sprays into both nostrils 2 (two) times daily. Use in each nostril  as directed   [DISCONTINUED] sertraline  (ZOLOFT ) 50 MG tablet TAKE 1 AND 1/2 TABLET BY MOUTH DAILY   No facility-administered medications prior to visit.    ROS All review of systems negative except what is listed in the HPI        Objective:     BP 111/72   Pulse 88   Ht 5' 3 (1.6 m)   Wt 153 lb (69.4 kg)   LMP 04/20/2018   SpO2 97%   BMI 27.10 kg/m    Physical Exam Vitals reviewed.  Constitutional:      General: She is not in acute distress.    Appearance: Normal appearance. She is not ill-appearing.  HENT:     Head: Normocephalic and atraumatic.     Right Ear: Tympanic membrane normal.     Left Ear: Tympanic membrane normal.     Nose: Nose normal.     Mouth/Throat:     Mouth: Mucous membranes are moist.     Pharynx: Oropharynx is clear.  Eyes:     Extraocular Movements: Extraocular movements  intact.     Conjunctiva/sclera: Conjunctivae normal.     Pupils: Pupils are equal, round, and reactive to light.  Cardiovascular:     Rate and Rhythm: Normal rate and regular rhythm.     Pulses: Normal pulses.     Heart sounds: Normal heart sounds.  Pulmonary:     Effort: Pulmonary effort is normal.     Breath sounds: Normal breath sounds.  Abdominal:     General: Abdomen is flat. Bowel sounds are normal. There is no distension.     Palpations: Abdomen is soft. There is no mass.     Tenderness: There is no abdominal tenderness. There is no right CVA tenderness, left CVA tenderness, guarding or rebound.     Hernia: No hernia is present.  Genitourinary:    Comments: Deferred exam Musculoskeletal:        General: Normal range of motion.     Cervical back: Normal range of motion and neck supple. No tenderness.     Right lower leg: No edema.     Left lower leg: No edema.  Lymphadenopathy:     Cervical: No cervical adenopathy.  Skin:    General: Skin is warm and dry.     Capillary Refill: Capillary refill takes less than 2 seconds.  Neurological:     General: No focal deficit present.     Mental Status: She is alert and oriented to person, place, and time. Mental status is at baseline.  Psychiatric:        Mood and Affect: Mood normal.        Behavior: Behavior normal.        Thought Content: Thought content normal.        Judgment: Judgment normal.      No results found for any visits on 09/28/24.     Assessment & Plan:    Routine Health Maintenance and Physical Exam Discussed health promotion and safety including diet and exercise recommendations, dental health, and injury prevention. Tobacco cessation if applicable. Seat belts, sunscreen, smoke detectors, etc.    Immunization History  Administered Date(s) Administered   PFIZER Comirnaty(Gray Top)Covid-19 Tri-Sucrose Vaccine 05/31/2021   PFIZER(Purple Top)SARS-COV-2 Vaccination 02/15/2020, 03/09/2020, 10/27/2020   Tdap  12/25/2015    Health Maintenance  Topic Date Due   HIV Screening  Never done   Hepatitis C Screening  Never done   Zoster Vaccines- Shingrix (1 of 2)  12/29/2024 (Originally 10/21/1984)   Influenza Vaccine  02/20/2025 (Originally 06/23/2024)   Cervical Cancer Screening (HPV/Pap Cotest)  09/28/2025 (Originally 05/20/2024)   Pneumococcal Vaccine: 50+ Years (1 of 1 - PCV) 09/28/2025 (Originally 10/22/2015)   Hepatitis B Vaccines 19-59 Average Risk (1 of 3 - 19+ 3-dose series) 09/28/2025 (Originally 10/21/1984)   COVID-19 Vaccine (5 - 2025-26 season) 09/28/2025 (Originally 07/24/2024)   DTaP/Tdap/Td (2 - Td or Tdap) 12/24/2025   Mammogram  04/13/2026   Colonoscopy  02/22/2028   HPV VACCINES  Aged Out   Meningococcal B Vaccine  Aged Out        Problem List Items Addressed This Visit   None Visit Diagnoses       Annual physical exam    -  Primary   Relevant Orders   CBC with Differential/Platelet   Comprehensive metabolic panel with GFR   TSH   Lipid panel     Encounter for medical examination to establish care         Cervical cancer screening         Vitamin D  deficiency       Relevant Orders   VITAMIN D  25 Hydroxy (Vit-D Deficiency, Fractures)     Screening for diabetes mellitus       Relevant Orders   Hemoglobin A1c     History of gestational diabetes       Relevant Orders   Hemoglobin A1c     Anxiety       Relevant Medications   sertraline  (ZOLOFT ) 50 MG tablet     Allergic rhinitis, unspecified seasonality, unspecified trigger       Relevant Medications   azelastine  (ASTELIN ) 0.1 % nasal spray          PATIENT COUNSELING:    Recommend that most people either abstain from alcohol or drink within safe limits (<=14/week and <=4 drinks/occasion for males, <=7/weeks and <= 3 drinks/occasion for females) and that the risk for alcohol disorders and other health effects rises proportionally with the number of drinks per week and how often a drinker exceeds daily  limits.   Diet: Recommend to adjust caloric intake to maintain or achieve ideal body weight, to reduce intake of dietary saturated fat and total fat, to limit sodium intake by avoiding high sodium foods and not adding table salt, and to maintain adequate dietary potassium and calcium  preferably from fresh fruits, vegetables, and low-fat dairy products.   Emphasized the importance of regular exercise.  Injury prevention: Recommend seatbelts, safety helmets, smoke detector, etc..   Dental health: Recommend regular tooth brushing, flossing, and dental visits.       Return in about 6 months (around 03/28/2025) for chronic disease management.     Waddell KATHEE Mon, NP

## 2024-09-29 ENCOUNTER — Ambulatory Visit: Payer: Self-pay | Admitting: Family Medicine

## 2024-09-29 LAB — CBC WITH DIFFERENTIAL/PLATELET
Basophils Absolute: 0.1 K/uL (ref 0.0–0.1)
Basophils Relative: 0.5 % (ref 0.0–3.0)
Eosinophils Absolute: 0.3 K/uL (ref 0.0–0.7)
Eosinophils Relative: 2.9 % (ref 0.0–5.0)
HCT: 41.9 % (ref 36.0–46.0)
Hemoglobin: 14 g/dL (ref 12.0–15.0)
Lymphocytes Relative: 31.9 % (ref 12.0–46.0)
Lymphs Abs: 3.3 K/uL (ref 0.7–4.0)
MCHC: 33.5 g/dL (ref 30.0–36.0)
MCV: 93.2 fl (ref 78.0–100.0)
Monocytes Absolute: 0.6 K/uL (ref 0.1–1.0)
Monocytes Relative: 5.9 % (ref 3.0–12.0)
Neutro Abs: 6.1 K/uL (ref 1.4–7.7)
Neutrophils Relative %: 58.8 % (ref 43.0–77.0)
Platelets: 269 K/uL (ref 150.0–400.0)
RBC: 4.49 Mil/uL (ref 3.87–5.11)
RDW: 14.2 % (ref 11.5–15.5)
WBC: 10.3 K/uL (ref 4.0–10.5)

## 2024-09-29 LAB — COMPREHENSIVE METABOLIC PANEL WITH GFR
ALT: 15 U/L (ref 0–35)
AST: 20 U/L (ref 0–37)
Albumin: 4.6 g/dL (ref 3.5–5.2)
Alkaline Phosphatase: 106 U/L (ref 39–117)
BUN: 28 mg/dL — ABNORMAL HIGH (ref 6–23)
CO2: 25 meq/L (ref 19–32)
Calcium: 9.4 mg/dL (ref 8.4–10.5)
Chloride: 104 meq/L (ref 96–112)
Creatinine, Ser: 0.83 mg/dL (ref 0.40–1.20)
GFR: 77.43 mL/min (ref >60.00)
Glucose, Bld: 93 mg/dL (ref 70–99)
Potassium: 4.2 meq/L (ref 3.5–5.1)
Sodium: 141 meq/L (ref 135–145)
Total Bilirubin: 0.3 mg/dL (ref 0.2–1.2)
Total Protein: 7.6 g/dL (ref 6.0–8.3)

## 2024-09-29 LAB — LIPID PANEL
Cholesterol: 186 mg/dL (ref 0–200)
HDL: 71.9 mg/dL (ref >39.00)
LDL Cholesterol: 59 mg/dL (ref 0–99)
NonHDL: 114.11
Total CHOL/HDL Ratio: 3
Triglycerides: 277 mg/dL — ABNORMAL HIGH (ref 0.0–149.0)
VLDL: 55.4 mg/dL — ABNORMAL HIGH (ref 0.0–40.0)

## 2024-09-29 LAB — TSH: TSH: 0.65 u[IU]/mL (ref 0.35–5.50)

## 2024-09-29 LAB — VITAMIN D 25 HYDROXY (VIT D DEFICIENCY, FRACTURES): VITD: 27.58 ng/mL — ABNORMAL LOW (ref 30.00–100.00)

## 2024-09-29 LAB — HEMOGLOBIN A1C: Hgb A1c MFr Bld: 6.2 % (ref 4.6–6.5)

## 2024-10-04 DIAGNOSIS — J3089 Other allergic rhinitis: Secondary | ICD-10-CM | POA: Diagnosis not present

## 2024-10-04 DIAGNOSIS — J3081 Allergic rhinitis due to animal (cat) (dog) hair and dander: Secondary | ICD-10-CM | POA: Diagnosis not present

## 2024-10-04 DIAGNOSIS — J301 Allergic rhinitis due to pollen: Secondary | ICD-10-CM | POA: Diagnosis not present

## 2024-10-04 DIAGNOSIS — J4520 Mild intermittent asthma, uncomplicated: Secondary | ICD-10-CM | POA: Diagnosis not present

## 2024-10-25 ENCOUNTER — Other Ambulatory Visit: Payer: Self-pay | Admitting: Cardiology

## 2024-11-19 ENCOUNTER — Other Ambulatory Visit: Payer: Self-pay | Admitting: Cardiology

## 2024-12-20 ENCOUNTER — Other Ambulatory Visit: Payer: Self-pay | Admitting: Family Medicine

## 2024-12-20 DIAGNOSIS — F419 Anxiety disorder, unspecified: Secondary | ICD-10-CM
